# Patient Record
Sex: Female | Born: 1995 | Race: White | Hispanic: No | Smoking: Current every day smoker
Health system: Southern US, Community
[De-identification: ages and names within clinical notes are randomized; demographics above are authoritative.]

## PROBLEM LIST (undated history)

## (undated) DIAGNOSIS — K589 Irritable bowel syndrome without diarrhea: Secondary | ICD-10-CM

## (undated) DIAGNOSIS — F32A Depression, unspecified: Secondary | ICD-10-CM

## (undated) DIAGNOSIS — F419 Anxiety disorder, unspecified: Secondary | ICD-10-CM

## (undated) DIAGNOSIS — K219 Gastro-esophageal reflux disease without esophagitis: Secondary | ICD-10-CM

## (undated) HISTORY — DX: Irritable bowel syndrome without diarrhea: K58.9

## (undated) HISTORY — PX: WISDOM TOOTH EXTRACTION: SHX21

## (undated) HISTORY — PX: CHOLECYSTECTOMY: SHX55

## (undated) HISTORY — DX: Gastro-esophageal reflux disease without esophagitis: K21.9

---

## 2018-12-01 NOTE — L&D Delivery Note (Addendum)
Delivery Note At 8:07 PM a viable and healthy female was delivered via Vaginal, Spontaneous (Presentation: LOA, L hand).  APGAR: pending charting, Placenta status: delivered intact.  Cord: 3 vessel  with the following complications: with postpartum hemorrhage.   Fundus was boggy, with fundal rub, inside of uterus swept manually with no obvious products of conception. Patient was given TXA, methergine, 824mcg rectal cytotec with no improvement. Patient continued to have poor tone and bleeding so attending and fellow were call Hemabate was then administed and then a Bakri balloon was inserted and filled to 500cc. The bleeding was successfully tamponaded. CBC sent and is pending   Anesthesia:  Epidural Episiotomy:  none Lacerations: none  Suture Repair: none Est. Blood Loss (mL):  859mL  Mom to postpartum.  Baby to Couplet care / Skin to Skin.  Nikki Dom Driver 2/63/7858, 8:50 PM  Patient is a G1P0 at [redacted]w[redacted]d who was admitted SROM, uncomplicated prenatal course.  She progressed with augmentation via cytotec and FB.  I was gloved and present for delivery in its entirety.  Second stage of labor progressed, baby delivered after 30 minutes.    Immediate PPH noted after delivery of placenta. CNM took over management of [place]. Methergine, cytotec, TXA given with minimal improvement. Manual sweep revealed no products of conception. Dr. Elonda Husky called to bedside. Hemobate given and bakari balloon placed. Bleeding stopped after placement of balloon.   Patient and FOB updated with course and plan of care. Verbalized understanding.   Wende Mott, CNM 9:00 PM

## 2019-03-14 ENCOUNTER — Encounter: Payer: Self-pay | Admitting: *Deleted

## 2019-03-15 ENCOUNTER — Other Ambulatory Visit: Payer: Self-pay

## 2019-03-15 ENCOUNTER — Encounter: Payer: Self-pay | Admitting: Certified Nurse Midwife

## 2019-03-15 ENCOUNTER — Ambulatory Visit (INDEPENDENT_AMBULATORY_CARE_PROVIDER_SITE_OTHER): Payer: Medicaid Other | Admitting: Certified Nurse Midwife

## 2019-03-15 ENCOUNTER — Other Ambulatory Visit (HOSPITAL_COMMUNITY)
Admission: RE | Admit: 2019-03-15 | Discharge: 2019-03-15 | Disposition: A | Discharge status: Home / Self Care | Payer: Medicaid Other | Source: Ambulatory Visit | Attending: Certified Nurse Midwife | Admitting: Certified Nurse Midwife

## 2019-03-15 VITALS — BP 96/59 | HR 77 | Ht 60.0 in | Wt 109.0 lb

## 2019-03-15 DIAGNOSIS — Z3400 Encounter for supervision of normal first pregnancy, unspecified trimester: Secondary | ICD-10-CM | POA: Diagnosis present

## 2019-03-15 DIAGNOSIS — O99330 Smoking (tobacco) complicating pregnancy, unspecified trimester: Secondary | ICD-10-CM

## 2019-03-15 DIAGNOSIS — O219 Vomiting of pregnancy, unspecified: Secondary | ICD-10-CM

## 2019-03-15 DIAGNOSIS — B373 Candidiasis of vulva and vagina: Secondary | ICD-10-CM | POA: Diagnosis not present

## 2019-03-15 DIAGNOSIS — O0930 Supervision of pregnancy with insufficient antenatal care, unspecified trimester: Secondary | ICD-10-CM | POA: Diagnosis not present

## 2019-03-15 DIAGNOSIS — B3731 Acute candidiasis of vulva and vagina: Secondary | ICD-10-CM

## 2019-03-15 DIAGNOSIS — Z3A19 19 weeks gestation of pregnancy: Secondary | ICD-10-CM

## 2019-03-15 DIAGNOSIS — O0932 Supervision of pregnancy with insufficient antenatal care, second trimester: Secondary | ICD-10-CM | POA: Diagnosis not present

## 2019-03-15 MED ORDER — ONDANSETRON 4 MG PO TBDP: 4.0000 mg | ORAL_TABLET | Freq: Three times a day (TID) | ORAL | 0 refills | Status: DC | PRN

## 2019-03-15 NOTE — Progress Notes (Signed)
History:   Kelly Allen is a 23 y.o. G1P0 at 7572w2d by LMP being seen today for her first obstetrical visit.  Her obstetrical history is significant for smoker and late to prenatal care. Patient does intend to breast feed. Pregnancy history fully reviewed.  Patient reports nausea.      HISTORY: OB History  Gravida Para Term Preterm AB Living  1 0 0 0 0 0  SAB TAB Ectopic Multiple Live Births  0 0 0 0 0    # Outcome Date GA Lbr Len/2nd Weight Sex Delivery Anes PTL Lv  1 Current             Patient has never had pap smear prior to 23yo   Past Medical History:  Diagnosis Date  . Acid reflux   . IBS (irritable bowel syndrome)    Past Surgical History:  Procedure Laterality Date  . CHOLECYSTECTOMY    . WISDOM TOOTH EXTRACTION     Family History  Problem Relation Age of Onset  . Diabetes Mother   . Asthma Father   . COPD Father   . Cancer Paternal Grandmother        stomach   Social History   Tobacco Use  . Smoking status: Current Every Day Smoker    Packs/day: 0.25    Years: 7.00    Pack years: 1.75    Types: Cigarettes  . Smokeless tobacco: Never Used  Substance Use Topics  . Alcohol use: Not Currently  . Drug use: Not Currently   No Known Allergies Current Outpatient Medications on File Prior to Visit  Medication Sig Dispense Refill  . Prenatal Vit-Fe Fumarate-FA (PRENATAL VITAMINS PO) Take by mouth.     No current facility-administered medications on file prior to visit.     Review of Systems Pertinent items noted in HPI and remainder of comprehensive ROS otherwise negative. Physical Exam:   Vitals:   03/15/19 0955 03/15/19 0956  BP: (!) 96/59   Pulse: 77   Weight: 109 lb (49.4 kg)   Height:  5' (1.524 m)   Fetal Heart Rate (bpm): 161 Uterus:  Fundal Height: 20 cm  Pelvic Exam: Perineum: no hemorrhoids, normal perineum   Vulva: normal external genitalia, no lesions   Vagina:  normal mucosa, normal discharge   Cervix: no lesions and normal, pap  smear done.    Adnexa: normal adnexa and no mass, fullness, tenderness   Bony Pelvis: average  System: General: well-developed, well-nourished female in no acute distress   Breasts:  normal appearance, no masses or tenderness bilaterally   Skin: normal coloration and turgor, no rashes   Neurologic: oriented, normal, negative, normal mood   Extremities: normal strength, tone, and muscle mass, ROM of all joints is normal   HEENT PERRLA, extraocular movement intact and sclera clear   Mouth/Teeth mucous membranes moist, pharynx normal without lesions and dental hygiene good   Neck supple and no masses   Cardiovascular: regular rate and rhythm   Respiratory:  no respiratory distress, normal breath sounds   Abdomen: soft, non-tender; bowel sounds normal; no masses,  no organomegaly    Assessment:    Pregnancy: G1P0 Patient Active Problem List   Diagnosis Date Noted  . Supervision of normal first pregnancy, antepartum 03/15/2019  . Tobacco use during pregnancy, antepartum 03/15/2019  . Late prenatal care 03/15/2019     Plan:    1. Supervision of normal first pregnancy, antepartum - Enroll patient in Babyscripts Program - [place] MFM OB  COMP + 14 WK; Future - Cytology - PAP( [place]) - Genetic Screening - Alpha fetoprotein, maternal - Hemoglobinopathy Evaluation  2. Late prenatal care - care initiated at 19 weeks  - Obstetric panel - HIV antibody (with reflex) - Culture, OB Urine - Babyscripts Schedule Optimization - [place] MFM OB COMP + 14 WK; Future - Cytology - PAP( [place]) - Genetic Screening - Alpha fetoprotein, maternal  3. Tobacco use during pregnancy, antepartum - Patient reports smoking cigarettes currently, reports smoking 2-3 cigarettes per day  - Educated and discussed risks to smoking during pregnancy including IUGR and fetal demise   Initial labs drawn. Continue prenatal vitamins. Genetic Screening discussed, NIPS: ordered. Ultrasound discussed; fetal  anatomic survey: ordered. Problem list reviewed and updated. The nature of [facility] with multiple MDs and other Advanced Practice Providers was explained to patient; also emphasized that residents, students are part of our team. Routine obstetric precautions reviewed. Return in about 4 weeks (around 04/12/2019) for TELEHEALTH ROB.    Sharyon Cable, CNM [facility] [employer], [facility]

## 2019-03-15 NOTE — Patient Instructions (Addendum)
Glucose Tolerance Test During Pregnancy Why am I having this test? The glucose tolerance test (GTT) is done to check how your body processes sugar (glucose). This is one of several tests used to diagnose diabetes that develops during pregnancy (gestational diabetes mellitus). Gestational diabetes is a temporary form of diabetes that some women develop during pregnancy. It usually occurs during the second trimester of pregnancy and goes away after delivery. Testing (screening) for gestational diabetes usually occurs between 24 and 28 weeks of pregnancy. You may have the GTT test after having a 1-hour glucose screening test if the results from that test indicate that you may have gestational diabetes. You may also have this test if:  You have a history of gestational diabetes.  You have a history of giving birth to very large babies or have experienced repeated fetal loss (stillbirth).  You have signs and symptoms of diabetes, such as: ? Changes in your vision. ? Tingling or numbness in your hands or feet. ? Changes in hunger, thirst, and urination that are not otherwise explained by your pregnancy. What is being tested? This test measures the amount of glucose in your blood at different times during a period of 3 hours. This indicates how well your body is able to process glucose. What kind of sample is taken?  Blood samples are required for this test. They are usually collected by inserting a needle into a blood vessel. How do I prepare for this test?  For 3 days before your test, eat normally. Have plenty of carbohydrate-rich foods.  Follow instructions from your health care provider about: ? Eating or drinking restrictions on the day of the test. You may be asked to not eat or drink anything other than water (fast) starting 8-10 hours before the test. ? Changing or stopping your regular medicines. Some medicines may interfere with this test. Tell a health care provider about:  All  medicines you are taking, including vitamins, herbs, eye drops, creams, and over-the-counter medicines.  Any blood disorders you have.  Any surgeries you have had.  Any medical conditions you have. What happens during the test? First, your blood glucose will be measured. This is referred to as your fasting blood glucose, since you fasted before the test. Then, you will drink a glucose solution that contains a certain amount of glucose. Your blood glucose will be measured again 1, 2, and 3 hours after drinking the solution. This test takes about 3 hours to complete. You will need to stay at the testing location during this time. During the testing period:  Do not eat or drink anything other than the glucose solution.  Do not exercise.  Do not use any products that contain nicotine or tobacco, such as cigarettes and e-cigarettes. If you need help stopping, ask your health care provider. The testing procedure may vary among health care providers and hospitals. How are the results reported? Your results will be reported as milligrams of glucose per deciliter of blood (mg/dL) or millimoles per liter (mmol/L). Your health care provider will compare your results to normal ranges that were established after testing a large group of people (reference ranges). Reference ranges may vary among labs and hospitals. For this test, common reference ranges are:  Fasting: less than 95-105 mg/dL (5.3-5.8 mmol/L).  1 hour after drinking glucose: less than 180-190 mg/dL (10.0-10.5 mmol/L).  2 hours after drinking glucose: less than 155-165 mg/dL (8.6-9.2 mmol/L).  3 hours after drinking glucose: 140-145 mg/dL (7.8-8.1 mmol/L). What do the   hour after drinking glucose: less than 180-190 mg/dL (10.0-10.5 mmol/L).   2 hours after drinking glucose: less than 155-165 mg/dL (8.6-9.2 mmol/L).   3 hours after drinking glucose: 140-145 mg/dL (7.8-8.1 mmol/L).  What do the results mean?  Results within reference ranges are considered normal, meaning that your glucose levels are well-controlled. If two or more of your blood glucose levels are high, you may be diagnosed with gestational diabetes. If only one level is high, your health care  provider may suggest repeat testing or other tests to confirm a diagnosis.  Talk with your health care provider about what your results mean.  Questions to ask your health care provider  Ask your health care provider, or the department that is doing the test:   When will my results be ready?   How will I get my results?   What are my treatment options?   What other tests do I need?   What are my next steps?  Summary   The glucose tolerance test (GTT) is one of several tests used to diagnose diabetes that develops during pregnancy (gestational diabetes mellitus). Gestational diabetes is a temporary form of diabetes that some women develop during pregnancy.   You may have the GTT test after having a 1-hour glucose screening test if the results from that test indicate that you may have gestational diabetes. You may also have this test if you have any symptoms or risk factors for gestational diabetes.   Talk with your health care provider about what your results mean.  This information is not intended to replace advice given to you by your health care provider. Make sure you discuss any questions you have with your health care provider.  Document Released: 05/18/2012 Document Revised: 06/29/2017 Document Reviewed: 06/29/2017  Elsevier Interactive Patient Education  2019 [employer]vier Inc[employer]  Second Trimester of Pregnancy    The second trimester is from week 14 through week 27 (month 4 through 6). This is often the time in pregnancy that you feel your best. Often times, morning sickness has lessened or quit. You may have more energy, and you may get hungry more often. Your unborn baby is growing rapidly. At the end of the sixth month, he or she is about 9 inches long and weighs about 1 pounds. You will likely feel the baby move between 18 and 20 weeks of pregnancy.  Follow these instructions at home:  Medicines   Take over-the-counter and prescription medicines only as told by your doctor. Some medicines are safe and  some medicines are not safe during pregnancy.   Take a prenatal vitamin that contains at least 600 micrograms (mcg) of folic acid.   If you have trouble pooping (constipation), take medicine that will make your stool soft (stool softener) if your doctor approves.  Eating and drinking     Eat regular, healthy meals.   Avoid raw meat and uncooked cheese.   If you get low calcium from the food you eat, talk to your doctor about taking a daily calcium supplement.   Avoid foods that are high in fat and sugars, such as fried and sweet foods.   If you feel sick to your stomach (nauseous) or throw up (vomit):  ? Eat 4 or 5 small meals a day instead of 3 large meals.  ? Try eating a few soda crackers.  ? Drink liquids between meals instead of during meals.   To prevent constipation:  ? Eat foods that are high in fiber, like   fresh fruits and vegetables, whole grains, and beans.  ? Drink enough fluids to keep your pee (urine) clear or pale yellow.  Activity   Exercise only as told by your doctor. Stop exercising if you start to have cramps.   Do not exercise if it is too hot, too humid, or if you are in a place of great height (high altitude).   Avoid heavy lifting.   Wear low-heeled shoes. Sit and stand up straight.   You can continue to have sex unless your doctor tells you not to.  Relieving pain and discomfort   Wear a good support bra if your breasts are tender.   Take warm water baths (sitz baths) to soothe pain or discomfort caused by hemorrhoids. Use hemorrhoid cream if your doctor approves.   Rest with your legs raised if you have leg cramps or low back pain.   If you develop puffy, bulging veins (varicose veins) in your legs:  ? Wear support hose or compression stockings as told by your doctor.  ? Raise (elevate) your feet for 15 minutes, 3-4 times a day.  ? Limit salt in your food.  Prenatal care   Write down your questions. Take them to your prenatal visits.   Keep all your prenatal visits as  told by your doctor. This is important.  Safety   Wear your seat belt when driving.   Make a list of emergency phone numbers, including numbers for family, friends, the hospital, and police and fire departments.  General instructions   Ask your doctor about the right foods to eat or for help finding a counselor, if you need these services.   Ask your doctor about local prenatal classes. Begin classes before month 6 of your pregnancy.   Do not use hot tubs, steam rooms, or saunas.   Do not douche or use tampons or scented sanitary pads.   Do not cross your legs for long periods of time.   Visit your dentist if you have not done so. Use a soft toothbrush to brush your teeth. Floss gently.   Avoid all smoking, herbs, and alcohol. Avoid drugs that are not approved by your doctor.   Do not use any products that contain nicotine or tobacco, such as cigarettes and e-cigarettes. If you need help quitting, ask your doctor.   Avoid cat litter boxes and soil used by cats. These carry germs that can cause birth defects in the baby and can cause a loss of your baby (miscarriage) or stillbirth.  Contact a doctor if:   You have mild cramps or pressure in your lower belly.   You have pain when you pee (urinate).   You have bad smelling fluid coming from your vagina.   You continue to feel sick to your stomach (nauseous), throw up (vomit), or have watery poop (diarrhea).   You have a nagging pain in your belly area.   You feel dizzy.  Get help right away if:   You have a fever.   You are leaking fluid from your vagina.   You have spotting or bleeding from your vagina.   You have severe belly cramping or pain.   You lose or gain weight rapidly.   You have trouble catching your breath and have chest pain.   You notice sudden or extreme puffiness (swelling) of your face, hands, ankles, feet, or legs.   You have not felt the baby move in over an hour.   You have severe headaches   that do not go away when you  take medicine.   You have trouble seeing.  Summary   The second trimester is from week 14 through week 27 (months 4 through 6). This is often the time in pregnancy that you feel your best.   To take care of yourself and your unborn baby, you will need to eat healthy meals, take medicines only if your doctor tells you to do so, and do activities that are safe for you and your baby.   Call your doctor if you get sick or if you notice anything unusual about your pregnancy. Also, call your doctor if you need help with the right food to eat, or if you want to know what activities are safe for you.  This information is not intended to replace advice given to you by your health care provider. Make sure you discuss any questions you have with your health care provider.  Document Released: 02/11/2010 Document Revised: 12/23/2016 Document Reviewed: 12/23/2016  Elsevier Interactive Patient Education  2019 Elsevier Inc.

## 2019-03-16 LAB — CYTOLOGY - PAP
Chlamydia: NEGATIVE
Diagnosis: NEGATIVE
Neisseria Gonorrhea: NEGATIVE

## 2019-03-17 ENCOUNTER — Telehealth: Payer: Self-pay

## 2019-03-17 LAB — HEMOGLOBINOPATHY EVALUATION
Fetal Hemoglobin Testing: <1 % (ref 0.0–1.9)
HCT: 35.7 % (ref 35.0–45.0)
Hemoglobin A2 - HGBRFX: 2.4 % (ref 1.8–3.5)
Hemoglobin: 12 g/dL (ref 11.7–15.5)
Hgb A: 96.6 % (ref >96.0)
MCH: 31.7 pg (ref 27.0–33.0)
MCV: 94.4 fL (ref 80.0–100.0)
RBC: 3.78 10*6/uL — ABNORMAL LOW (ref 3.80–5.10)
RDW: 12.9 % (ref 11.0–15.0)

## 2019-03-17 LAB — CULTURE, OB URINE

## 2019-03-17 LAB — URINE CULTURE, OB REFLEX

## 2019-03-17 MED ORDER — TERCONAZOLE 0.4 % VA CREA: 1.0000 | TOPICAL_CREAM | Freq: Every day | VAGINAL | 0 refills | Status: DC

## 2019-03-17 NOTE — Addendum Note (Signed)
Addended by: Sharyon Cable on: 03/17/2019 08:56 AM   Modules accepted: Orders

## 2019-03-17 NOTE — Telephone Encounter (Addendum)
Left a VM for pt letting her know results ----- Message from Sharyon Cable, CNM sent at 03/17/2019  8:56 AM EDT ----- Normal pap, patient has yeast infection on pap. Please call patient and notify of results. Rx for yeast infection sent to pharmacy on file.  Sharyon Cable, CNM 03/17/19, 8:55 AM

## 2019-03-21 ENCOUNTER — Telehealth: Payer: Self-pay

## 2019-03-21 DIAGNOSIS — Z3400 Encounter for supervision of normal first pregnancy, unspecified trimester: Secondary | ICD-10-CM

## 2019-03-21 NOTE — Telephone Encounter (Signed)
Left VM for pt letting her know we have NIPS results for her and provided office number for her to call back

## 2019-03-23 ENCOUNTER — Ambulatory Visit (HOSPITAL_COMMUNITY)
Admission: RE | Admit: 2019-03-23 | Discharge: 2019-03-23 | Disposition: A | Discharge status: Home / Self Care | Payer: Medicaid Other | Source: Ambulatory Visit | Attending: Obstetrics and Gynecology | Admitting: Obstetrics and Gynecology

## 2019-03-23 ENCOUNTER — Other Ambulatory Visit: Payer: Self-pay

## 2019-03-23 DIAGNOSIS — O0932 Supervision of pregnancy with insufficient antenatal care, second trimester: Secondary | ICD-10-CM

## 2019-03-23 DIAGNOSIS — O99332 Smoking (tobacco) complicating pregnancy, second trimester: Secondary | ICD-10-CM

## 2019-03-23 DIAGNOSIS — O0930 Supervision of pregnancy with insufficient antenatal care, unspecified trimester: Secondary | ICD-10-CM | POA: Insufficient documentation

## 2019-03-23 DIAGNOSIS — Z363 Encounter for antenatal screening for malformations: Secondary | ICD-10-CM

## 2019-03-23 DIAGNOSIS — Z3A18 18 weeks gestation of pregnancy: Secondary | ICD-10-CM

## 2019-03-23 DIAGNOSIS — Z3400 Encounter for supervision of normal first pregnancy, unspecified trimester: Secondary | ICD-10-CM | POA: Diagnosis present

## 2019-03-23 DIAGNOSIS — Z3687 Encounter for antenatal screening for uncertain dates: Secondary | ICD-10-CM | POA: Diagnosis not present

## 2019-03-24 ENCOUNTER — Other Ambulatory Visit (HOSPITAL_COMMUNITY): Payer: Self-pay | Admitting: *Deleted

## 2019-03-24 DIAGNOSIS — Z362 Encounter for other antenatal screening follow-up: Secondary | ICD-10-CM

## 2019-03-24 LAB — ALPHA FETOPROTEIN, MATERNAL
AFP MoM: 0.59
AFP, Serum: 37.5 ng/mL
Calc'd Gestational Age: 19.3 weeks
Maternal Wt: 109 [lb_av]
Risk for ONTD: <1
Twins-AFP: 1

## 2019-03-24 LAB — OBSTETRIC PANEL
Absolute Monocytes: 504 cells/uL (ref 200–950)
Antibody Screen: NOT DETECTED
Basophils Absolute: 18 cells/uL (ref 0–200)
Basophils Relative: 0.2 %
Eosinophils Absolute: 81 cells/uL (ref 15–500)
Eosinophils Relative: 0.9 %
HCT: 34.7 % — ABNORMAL LOW (ref 35.0–45.0)
Hemoglobin: 12.2 g/dL (ref 11.7–15.5)
Hepatitis B Surface Ag: NONREACTIVE
Lymphs Abs: 1503 cells/uL (ref 850–3900)
MCH: 32.3 pg (ref 27.0–33.0)
MCHC: 35.2 g/dL (ref 32.0–36.0)
MCV: 91.8 fL (ref 80.0–100.0)
MPV: 10.4 fL (ref 7.5–12.5)
Monocytes Relative: 5.6 %
Neutro Abs: 6894 cells/uL (ref 1500–7800)
Neutrophils Relative %: 76.6 %
Platelets: 235 10*3/uL (ref 140–400)
RBC: 3.78 10*6/uL — ABNORMAL LOW (ref 3.80–5.10)
RDW: 12.6 % (ref 11.0–15.0)
RPR Ser Ql: NONREACTIVE
Rubella: 3.33 index
Total Lymphocyte: 16.7 %
WBC: 9 10*3/uL (ref 3.8–10.8)

## 2019-03-24 LAB — TIQ-AOE

## 2019-03-24 LAB — HIV ANTIBODY (ROUTINE TESTING W REFLEX): HIV 1&2 Ab, 4th Generation: NONREACTIVE

## 2019-04-12 ENCOUNTER — Encounter: Payer: Medicaid Other | Admitting: Certified Nurse Midwife

## 2019-04-13 ENCOUNTER — Ambulatory Visit (INDEPENDENT_AMBULATORY_CARE_PROVIDER_SITE_OTHER): Payer: Medicaid Other | Admitting: Obstetrics and Gynecology

## 2019-04-13 ENCOUNTER — Other Ambulatory Visit: Payer: Self-pay

## 2019-04-13 DIAGNOSIS — Z3402 Encounter for supervision of normal first pregnancy, second trimester: Secondary | ICD-10-CM

## 2019-04-13 DIAGNOSIS — Z3400 Encounter for supervision of normal first pregnancy, unspecified trimester: Secondary | ICD-10-CM

## 2019-04-13 NOTE — Patient Instructions (Signed)
Tdap Vaccine (Tetanus, Diphtheria and Pertussis): What You Need to Know  1. Why get vaccinated?  Tetanus, diphtheria and pertussis are very serious diseases. Tdap vaccine can protect us from these diseases. And, Tdap vaccine given to pregnant women can protect newborn babies against pertussis..  TETANUS (Lockjaw) is rare in the [place] today. It causes painful muscle tightening and stiffness, usually all over the body.  · It can lead to tightening of muscles in the head and neck so you can't open your mouth, swallow, or sometimes even breathe. Tetanus kills about 1 out of 10 people who are infected even after receiving the best medical care.  DIPHTHERIA is also rare in the [place] today. It can cause a thick coating to form in the back of the throat.  · It can lead to breathing problems, heart failure, paralysis, and death.  PERTUSSIS (Whooping Cough) causes severe coughing spells, which can cause difficulty breathing, vomiting and disturbed sleep.  · It can also lead to weight loss, incontinence, and rib fractures. Up to 2 in 100 adolescents and 5 in 100 adults with pertussis are hospitalized or have complications, which could include pneumonia or death.  These diseases are caused by bacteria. Diphtheria and pertussis are spread from person to person through secretions from coughing or sneezing. Tetanus enters the body through cuts, scratches, or wounds.  Before vaccines, as many as 200,000 cases of diphtheria, 200,000 cases of pertussis, and hundreds of cases of tetanus, were reported in the [place] each year. Since vaccination began, reports of cases for tetanus and diphtheria have dropped by about 99% and for pertussis by about 80%.  2. Tdap vaccine  Tdap vaccine can protect adolescents and adults from tetanus, diphtheria, and pertussis. One dose of Tdap is routinely given at age 11 or 12. People who did not get Tdap at that age should get it as soon as possible.  Tdap is especially important  for healthcare professionals and anyone having close contact with a baby younger than 12 months.  Pregnant women should get a dose of Tdap during every pregnancy, to protect the newborn from pertussis. Infants are most at risk for severe, life-threatening complications from pertussis.  Another vaccine, called Td, protects against tetanus and diphtheria, but not pertussis. A Td booster should be given every 10 years. Tdap may be given as one of these boosters if you have never gotten Tdap before. Tdap may also be given after a severe cut or burn to prevent tetanus infection.  Your doctor or the person giving you the vaccine can give you more information.  Tdap may safely be given at the same time as other vaccines.  3. Some people should not get this vaccine  · A person who has ever had a life-threatening allergic reaction after a previous dose of any diphtheria, tetanus or pertussis containing vaccine, OR has a severe allergy to any part of this vaccine, should not get Tdap vaccine. Tell the person giving the vaccine about any severe allergies.  · Anyone who had coma or long repeated seizures within 7 days after a childhood dose of DTP or DTaP, or a previous dose of Tdap, should not get Tdap, unless a cause other than the vaccine was found. They can still get Td.  · Talk to your doctor if you:  ? have seizures or another nervous system problem,  ? had severe pain or swelling after any vaccine containing diphtheria, tetanus or pertussis,  ? ever had a condition   called Guillain-Barré Syndrome (GBS),  ? aren't feeling well on the day the shot is scheduled.  4. Risks  With any medicine, including vaccines, there is a chance of side effects. These are usually mild and go away on their own. Serious reactions are also possible but are rare.  Most people who get Tdap vaccine do not have any problems with it.  Mild problems following Tdap  (Did not interfere with activities)  · Pain where the shot was given (about 3 in 4  adolescents or 2 in 3 adults)  · Redness or swelling where the shot was given (about 1 person in 5)  · Mild fever of at least 100.4°F (up to about 1 in 25 adolescents or 1 in 100 adults)  · Headache (about 3 or 4 people in 10)  · Tiredness (about 1 person in 3 or 4)  · Nausea, vomiting, diarrhea, stomach ache (up to 1 in 4 adolescents or 1 in 10 adults)  · Chills, sore joints (about 1 person in 10)  · Body aches (about 1 person in 3 or 4)  · Rash, swollen glands (uncommon)  Moderate problems following Tdap  (Interfered with activities, but did not require medical attention)  · Pain where the shot was given (up to 1 in 5 or 6)  · Redness or swelling where the shot was given (up to about 1 in 16 adolescents or 1 in 12 adults)  · Fever over 102°F (about 1 in 100 adolescents or 1 in 250 adults)  · Headache (about 1 in 7 adolescents or 1 in 10 adults)  · Nausea, vomiting, diarrhea, stomach ache (up to 1 or 3 people in 100)  · Swelling of the entire arm where the shot was given (up to about 1 in 500).  Severe problems following Tdap  (Unable to perform usual activities; required medical attention)  · Swelling, severe pain, bleeding and redness in the arm where the shot was given (rare).  Problems that could happen after any vaccine:  · People sometimes faint after a medical procedure, including vaccination. Sitting or lying down for about 15 minutes can help prevent fainting, and injuries caused by a fall. Tell your doctor if you feel dizzy, or have vision changes or ringing in the ears.  · Some people get severe pain in the shoulder and have difficulty moving the arm where a shot was given. This happens very rarely.  · Any medication can cause a severe allergic reaction. Such reactions from a vaccine are very rare, estimated at fewer than 1 in a million doses, and would happen within a few minutes to a few hours after the vaccination.  As with any medicine, there is a very remote chance of a vaccine causing a serious  injury or death.  The safety of vaccines is always being monitored. For more information, visit: www.cdc.gov/vaccinesafety/  5. What if there is a serious problem?  What should I look for?  · Look for anything that concerns you, such as signs of a severe allergic reaction, very high fever, or unusual behavior.  Signs of a severe allergic reaction can include hives, swelling of the face and throat, difficulty breathing, a fast heartbeat, dizziness, and weakness. These would usually start a few minutes to a few hours after the vaccination.  What should I do?  · If you think it is a severe allergic reaction or other emergency that can't wait, call 9-1-1 or get the person to the nearest hospital. Otherwise,   call your doctor.  · Afterward, the reaction should be reported to the Vaccine Adverse Event Reporting System (VAERS). Your doctor might file this report, or you can do it yourself through the VAERS web site at www.vaers.hhs.gov, or by calling 1-800-822-7967.  VAERS does not give medical advice.  6. The National Vaccine Injury Compensation Program  The National Vaccine Injury Compensation Program (VICP) is a federal program that was created to compensate people who may have been injured by certain vaccines.  Persons who believe they may have been injured by a vaccine can learn about the program and about filing a claim by calling 1-800-338-2382 or visiting the VICP website at www.hrsa.gov/vaccinecompensation. There is a time limit to file a claim for compensation.  7. How can I learn more?  · Ask your doctor. He or she can give you the vaccine package insert or suggest other sources of information.  · Call your local or state health department.  · Contact the Centers for Disease Control and Prevention (CDC):  ? Call 1-800-232-4636 (1-800-CDC-INFO) or  ? Visit CDC's website at www.cdc.gov/vaccines  Vaccine Information Statement Tdap Vaccine (01/24/2014)  This information is not intended to replace advice given to you  by your health care provider. Make sure you discuss any questions you have with your health care provider.  Document Released: 05/18/2012 Document Revised: 07/05/2018 Document Reviewed: 07/05/2018  Elsevier Interactive Patient Education © 2019 [employer]

## 2019-04-13 NOTE — Progress Notes (Signed)
   TELEHEALTH VIRTUAL OBSTETRICS VISIT ENCOUNTER NOTE  I connected with Kelly Allen on 04/14/19 at  9:45 AM EDT by Webex at home and verified that I am speaking with the correct person using two identifiers.   I discussed the limitations, risks, security and privacy concerns of performing an evaluation and management service by telephone and the availability of in person appointments. I also discussed with the patient that there may be a patient responsible charge related to this service. The patient expressed understanding and agreed to proceed.  Subjective:  Kelly Allen is a 23 y.o. G1P0 at [redacted]w[redacted]d being followed for ongoing prenatal care.  She is currently monitored for the following issues for this low-risk pregnancy and has Supervision of normal first pregnancy, antepartum; Tobacco use during pregnancy, antepartum; and Late prenatal care on their problem list.  Patient reports no complaints. Reports fetal movement. Denies any contractions, bleeding or leaking of fluid.   The following portions of the patient's history were reviewed and updated as appropriate: allergies, current medications, past family history, past medical history, past social history, past surgical history and problem list.   Objective:   General:  Alert, oriented and cooperative.   Mental Status: Normal mood and affect perceived. Normal judgment and thought content.  Rest of physical exam deferred due to type of encounter  Assessment and Plan:  Pregnancy: G1P0 at [redacted]w[redacted]d  1. Supervision of normal first pregnancy, antepartum   BP: 114/78 Doing well no concerns Tdap next visit.  Next visit in person for 2 hour GTT   Preterm labor symptoms and general obstetric precautions including but not limited to vaginal bleeding, contractions, leaking of fluid and fetal movement were reviewed in detail with the patient.  I discussed the assessment and treatment plan with the patient. The patient was provided an opportunity to  ask questions and all were answered. The patient agreed with the plan and demonstrated an understanding of the instructions. The patient was advised to call back or seek an in-person office evaluation/go to [facility] at [facility] for any urgent or concerning symptoms. Please refer to After Visit Summary for other counseling recommendations.   I provided 11 minutes of non-face-to-face time during this encounter.  Return in about 4 weeks (around 05/11/2019) for In person visit for 2 hour GTT.  Future Appointments  Date Time Provider Department Center  05/05/2019  1:45 PM [facility] [place] [facility]  05/11/2019  8:15 AM Guido Comp, Harolyn Rutherford, NP [facility]    Venia Carbon, NP [facility] [employer], [facility]

## 2019-04-13 NOTE — Progress Notes (Signed)
BP on 04/11/19: 114/78

## 2019-05-05 ENCOUNTER — Ambulatory Visit (HOSPITAL_COMMUNITY)
Admission: RE | Admit: 2019-05-05 | Discharge: 2019-05-05 | Disposition: A | Discharge status: Home / Self Care | Payer: Medicaid Other | Source: Ambulatory Visit | Attending: Obstetrics and Gynecology | Admitting: Obstetrics and Gynecology

## 2019-05-05 ENCOUNTER — Other Ambulatory Visit: Payer: Self-pay

## 2019-05-05 DIAGNOSIS — Z3A24 24 weeks gestation of pregnancy: Secondary | ICD-10-CM

## 2019-05-05 DIAGNOSIS — O99332 Smoking (tobacco) complicating pregnancy, second trimester: Secondary | ICD-10-CM

## 2019-05-05 DIAGNOSIS — Z362 Encounter for other antenatal screening follow-up: Secondary | ICD-10-CM | POA: Insufficient documentation

## 2019-05-05 DIAGNOSIS — O0932 Supervision of pregnancy with insufficient antenatal care, second trimester: Secondary | ICD-10-CM

## 2019-05-11 ENCOUNTER — Other Ambulatory Visit: Payer: Self-pay

## 2019-05-11 ENCOUNTER — Ambulatory Visit (INDEPENDENT_AMBULATORY_CARE_PROVIDER_SITE_OTHER): Payer: Medicaid Other | Admitting: Obstetrics and Gynecology

## 2019-05-11 DIAGNOSIS — Z3402 Encounter for supervision of normal first pregnancy, second trimester: Secondary | ICD-10-CM

## 2019-05-11 DIAGNOSIS — Z23 Encounter for immunization: Secondary | ICD-10-CM

## 2019-05-11 DIAGNOSIS — Z3A27 27 weeks gestation of pregnancy: Secondary | ICD-10-CM

## 2019-05-11 DIAGNOSIS — Z3400 Encounter for supervision of normal first pregnancy, unspecified trimester: Secondary | ICD-10-CM

## 2019-05-11 NOTE — Progress Notes (Signed)
   PRENATAL VISIT NOTE  Subjective:  Kelly Allen is a 23 y.o. G1P0 at [redacted]w[redacted]d being seen today for ongoing prenatal care.  She is currently monitored for the following issues for this low-risk pregnancy and has Supervision of normal first pregnancy, antepartum; Tobacco use during pregnancy, antepartum; and Late prenatal care on their problem list.  Patient reports no complaints.  Contractions: Not present. Vag. Bleeding: None.  Movement: Present. Denies leaking of fluid.   The following portions of the patient's history were reviewed and updated as appropriate: allergies, current medications, past family history, past medical history, past social history, past surgical history and problem list.   Objective:   Vitals:   05/11/19 0820  BP: 93/61  Pulse: 75  Weight: 118 lb (53.5 kg)    Fetal Status: Fetal Heart Rate (bpm): 154   Movement: Present     General:  Alert, oriented and cooperative. Patient is in no acute distress.  Skin: Skin is warm and dry. No rash noted.   Cardiovascular: Normal heart rate noted  Respiratory: Normal respiratory effort, no problems with respiration noted  Abdomen: Soft, gravid, appropriate for gestational age.  Pain/Pressure: Absent     Pelvic: Cervical exam deferred        Extremities: Normal range of motion.  Edema: None  Mental Status: Normal mood and affect. Normal behavior. Normal judgment and thought content.   Assessment and Plan:  Pregnancy: G1P0 at [redacted]w[redacted]d 1. Supervision of normal first pregnancy, antepartum  - 2Hr GTT w/ 1 Hr Carpenter 75 g - CBC - RPR - HIV antibody (with reflex) - Dating changed per Dr. Gertie Exon. Patient 25 weeks. She was given her labs slips for her 2 hour GTT and told to come back in 2 weeks to the lab to complete. She is agreeable to that plan of care.   Preterm labor symptoms and general obstetric precautions including but not limited to vaginal bleeding, contractions, leaking of fluid and fetal movement were reviewed in  detail with the patient. Please refer to After Visit Summary for other counseling recommendations.   No follow-ups on file.  Future Appointments  Date Time Provider [place]  06/10/2019  8:45 AM Wende Mott, CNM [facility]    Noni Saupe, NP

## 2019-05-11 NOTE — Patient Instructions (Signed)
AREA PEDIATRIC/FAMILY PRACTICE PHYSICIANS  [place] ([address]) . [facility] o Chambliss, MD; Eniola, MD; Hale, MD; Hensel, MD; McDiarmid, MD; McIntyer, MD; Neal, MD; Walden, MD o [address] [place], [place] [address] o [phone] o Mon-Fri 8:30-12:30, 1:30-5:00 o Providers come to see babies at [facility] o Accepting Medicaid . [facility] o Limited providers who accept newborns: Koirala, MD; Morrow, MD; Wolters, MD o [address], [place], [place] [address] o [phone] o Mon-Fri 8:00-5:30 o Babies seen by providers at [facility] o Does NOT accept Medicaid o Please call early in hospitalization for appointment (limited availability)  . [facility] o [place], MD o [address] [place], [place] [address] o [phone] o Mon, Tue, Thur, Fri 8:30-5:00, Wed 10:00-7:00 (closed 1-2pm) o Babies seen by [facility] providers o Accepting Medicaid . [facility] - Pediatrician o Rubin, MD o [address], [place], [place] [address] o [phone] o Mon-Fri 8:30-5:00, Sat 8:30-12:00 o Provider comes to see babies at [facility] o Accepting Medicaid o Must have been referred from current patients or contacted office prior to delivery . [facility] ([facility]) o Brown, MD; Chandler, MD; Ettefagh, MD; Grant, MD; Lester, MD; McCormick, MD; McQueen, MD; Prose, MD; Simha, MD; Stanley, MD; Stryffeler, NP; Tebben, NP o [address], [place], [place] [address] o [phone] o Mon, Tue, Thur, Fri 8:30-5:30, Wed 9:30-5:30, Sat 8:30-12:30 o Babies seen by [facility] providers o Accepting Medicaid o Only accepting infants of first-time parents or siblings of current patients o Hospital discharge coordinator will make follow-up appointment . Jack Amos o [address],  [place], [place]  [address] o [phone]   Fax - [phone] . [facility] o [address], [place], [place]  [address] o Phone - [phone]   Fax - [phone] . Shilpa Gosrani o [address], [place], [place]  [address] o [phone]  [place] ([address]) . [facility] o Bates, MD; Brassfield, MD; Cooper, Cox, MD; MD; Davis, MD; Dovico, MD; Ettefaugh, MD; Little, MD; Lowe, MD; Keiffer, MD; Melvin, MD; Sumner, MD; Williams, MD o [address], [place], [place] [address] o [phone] o Mon-Fri 8:30-5:00 (extended evenings Mon-Thur as needed), Sat-Sun 10:00-1:00 o Providers come to see babies at [facility] o Accepting Medicaid for families of first-time babies and families with all children in the household age 3 and under. Must register with office prior to making appointment (M-F only). . [facility] o Henson, NP; Knapp, MD; Lalonde, MD; Tysinger, PA o [address] [place], [place] [address] o [phone] o Mon-Fri 8:00-5:00 o Babies seen by providers at [facility] o Does NOT accept Medicaid/Commercial Insurance Only . [facility] ([facility])  o Artis, MD; Barnes, MD; Bratton, MD; Coccaro, MD; Lockett Gardner, MD; Kramer, MD; Marshall, MD; Netherton, MD; Poleto, MD; Skinner, MD o [address] [place],  [address] o [phone] o Mon-Fri 8:30-5:30, Sat (Oct.-Mar.) 9:00-1:00 o Babies seen by providers at [facility] o Accepting Medicaid  [place] ([address]) . [facility] o Reid, MD; Warner, MD o [address], [place],  [address] o [phone] o Mon-Fri 8:30-5:00, Sat 8:30-12:00 o Providers come to see babies at [facility] o Does NOT accept Medicaid . [facility] o Becker, PA; Hagler, MD; Scifres, PA; Sun, MD; Swayne, MD o [address],  [place], [place] [address] o [phone] o Mon-Fri 8:00-5:00 o Babies seen by providers at [facility] o Does NOT accept Medicaid o Only accepting babies of parents who are patients o Please call early in hospitalization for appointment (limited availability) . [facility] o Clark, MD; Frye, MD; Kelleher, MD; Mack, NP; Miller, MD; O'Keller, MD; Patterson, NP; Pudlo, MD; Puzio, MD; Thomas, MD; Tucker, MD; Twiselton, MD o [address], [place], [place] [address] o [phone] o Mon-Fri 8:00-5:00, Sat 9:00-12:00 o Providers come to see babies at [facility] o Does NOT accept Medicaid  [place] ([address]) . [facility] o Limited providers accepting new patients: Brake, NP; Wharton, PA o [address], [place], [place] [address] o [phone] o Mon-Fri 8:00-5:00 o Babies seen by providers at [facility] o Does NOT accept Medicaid o Only accepting babies of parents who are patients o Please call early in hospitalization for appointment (limited availability) . [facility] o Gay, MD; Quinlan, MD o [address] [place], [place] [address] o [phone] (press 1 to schedule appointment) o Mon-Fri 8:00-5:00 o Providers come to see babies at [facility] o Does NOT accept Medicaid . [facility] o Mazer, MD o [address] [place], [place] [address] o [phone] o Mon-Fri 8:30-5:00 (lunch 12:30-1:00), extended hours by appointment only Wed 5:00-6:30 o Babies seen by [facility] providers o Accepting Medicaid . [facility] o Banks, MD; Jordan, MD; Koberlein, MD o [address], [place], [place] [address] o [phone] o Mon-Fri 8:00-5:00 o Babies seen by [facility] providers o Does NOT accept Medicaid . [facility] o Parker, MD; Hunter, MD; Wallace, DO o [address] [place], [place]  [address] o [phone] o Mon-Fri 8:00-5:00 o Babies seen by [facility] providers o Does NOT accept Medicaid . [facility] o Brandon, PA; Brecken, PA; Christy, NP; Dees, MD; DeClaire, MD; DeWeese, MD; Hansen, NP; Mills, NP; Parrish, NP; Smoot, NP; Summer, MD; Vapne, MD o [address] [place], [place] [address] o [phone] o Mon-Fri 8:30-5:00, Sat 10:00-1:00 o Providers come to see babies at [facility] o Does NOT accept Medicaid o Free prenatal information session Tuesdays at 4:45pm . [facility] o Bouska, MD; Gordon, PA; Jeffery, PA; Weber, PA o [address] [place] [address] [place] [address] o [phone] o Mon-Fri 7:30-5:30 o Babies seen by [facility] providers . [facility] o [address], [place], [place]  [address] o [phone]   Fax - [phone]  [place] ([address]) . [facility] o Reese, MD o [address] [place], [place] [address] o [phone] o Mon-Thur 8:00-6:00 o Providers come to see babies at [facility] o Accepting Medicaid . [facility] o Anderson, NP; Badger, MD; Beal, PA; Spencer, PA o [address] [place], [place] [address] o [phone] o Mon-Thur 7:30-7:30, Fri 7:30-4:30 o Babies seen by [facility] providers o Accepting Medicaid . [facility] o Agbuya, MD; Klett, NP; Romgoolam, MD o [address] [place] [address], [place],  [address] o [phone] o Mon-Fri 8:30-5:00, Sat 8:30-12:00 o Providers come to see babies at [facility] o Accepting Medicaid o Must have "Meet & Greet" appointment at office prior to delivery . [facility] o McCord,   MD; Wallace, MD; Wood, MD o [address] [place] [address], [place], [place] [address] o [phone] o Mon-Wed 8:00-6:00, Thur-Fri 8:00-5:00, Sat 9:00-12:00 o Providers come to  see babies at [facility] o Does NOT accept Medicaid o Only accepting siblings of current patients . [facility]  o [address], [place], [place]  [address] o [phone]   Fax - [phone] . [facility] o [address], [place], [place]  [address] o [phone]   Fax - [phone]  [place] ([address]) . [facility] o Cirigliano, DO; Matthews, DO o [address] [place], [place] [address] o [phone] o Mon-Fri 7:00-5:00 o Babies seen by [facility] providers o Does NOT accept Medicaid . [facility] o Briscoe, MD; Howley, PA; Moreira, PA o [address], [place], [place] [address] o [phone] o Mon-Fri 8:00-5:00 o Babies seen by [facility] providers o Accepting Medicaid . [facility] o Boyd, MD; Church, PA; Jones, NP; Osborn, PA o [address], , [place] [address] o [phone] o Mon-Fri 8:00-5:00 o Babies seen by providers at [facility] o Accepting Medicaid  [place] ([address]) . [facility] [place] o [place], DO o [address] [place], [place] [address] o [phone] o Mon-Fri 8:00-5:00 o Babies seen by [facility] providers o Does NOT accept Medicaid o Limited availability, please call early in hospitalization to schedule follow-up . [facility] o Calderon, PA; Cummings, MD; Dillard, MD; Martin, PA; Olson, MD; VanDeven, PA o [address], [place], [place] [address] o [phone] o Mon-Fri 8:30-5:00, Sat 9:00-12:00 o Babies seen by providers at [facility] o Accepting Medicaid o Please register online then schedule online or call office o [email or web address] . [facility] ([facility] o Hunter, NP; Kumar, MD; Martin Rogers, PA o [address], [place], [place] [address] o [phone] o Mon-Fri 8:00-5:00 o Babies seen by providers at [facility] o Accepting Medicaid . [facility] ([facility] o [place], MD; Kristi Fleenor, NP; West, MD o [address], [place], [place] [address] o [phone] o Mon-Fri 8:00-5:30, Sat&Sun by appointment (phones open at 8:30) o Babies seen by [facility] providers o Accepting Medicaid o Must be a first-time baby or sibling of current patient . [facility] [place]  o [address], [place], [place]  [address] o [phone]   Fax - [phone]  [place] ([address]) . [facility] o Brown, PA; Cowen, PA; Rice, MD; Helton, PA; Spry, MD o [address] [place],  [address] o [phone] o Mon-Thur 8:00-7:00, Fri 8:00-5:00, Sat 8:00-12:00, Sun 9:00-12:00 o Babies seen by [facility] providers o Accepting Medicaid . [facility] o Coe-Goins, MD; Marshall, MD; Pierre-Louis, MD o [address] [place] [address], [place],  [address] o [phone] o Mon-Thur 8:00-5:00 o Babies seen by providers at [facility] o Accepting Medicaid . [facility] o Bratton, MD; Coe-Goins, MD; Hayes, MD; Vale, MD; List, MD; Lott, MD; Marshall, MD; Moran, MD; O'Neal, MD; Pierre-Louis, MD; Pitonzo, MD; Scholer, MD; Spangle, MD o [address] [place],    [address] o [phone] o Mon-Fri 8:00-5:30, Sat (Oct.-Mar.) 9:00-1:00 o Babies seen by providers at [facility] o Accepting Medicaid o Must fill out new patient packet, available online at [email or web address] . [facility] ([facility]) o Friddle, NP; Harris, NP; Kelly, NP; Logan, MD;  Melvin, PA; Poth, MD; Ramadoss, MD; Stanton, NP o [address], [place], [place] [address] o [phone] o Mon-Thur 8:00-5:30, Fri 8:00-5:00 o Babies seen by providers at [facility] o Accepting Medicaid  [place] ([address]) . [facility] o Dixon, PA; Wilbur, MD; Pickard, MD; Tapia, PA o [address], [place], [place] [address] o [phone] o Mon-Fri 8:00-5:00 o Babies seen by providers at [facility] o Accepting Medicaid   [place] ([address]) . [facility] [place] o Masneri, DO; Meyers, MD; Nelson, PA o [address], [place], [place] [address] o [phone] o Mon-Fri 8:00-5:00 o Babies seen by providers at [facility] o Does NOT accept Medicaid o Limited appointment availability, please call early in hospitalization  . [facility] [place] o Kunedd, DO; McGowen, MD o [address], [place], [place] [address] o [phone] o Mon-Fri 8:00-5:00 o Babies seen by [facility] providers o Does NOT accept Medicaid . [facility] [place] o Cameron, MD; MacDonald, MD; Michaels, PA; Nayak, MD o [address] [place] [address], [place], [place] [address] o [phone] o Mon-Fri 8:00-5:00 o After hours clinic ([address] [place], [place] [address]) [phone] Mon-Fri 5:00-8:00, Sat 12:00-6:00, Sun 10:00-4:00 o Babies seen by [facility] providers o Accepting Medicaid . [facility] [place] o [address], [place], [place]  [address] o [phone]   Fax - [phone]  [place] ([address]) . [facility] [place] [facility] o Andy, MD o [address], [place], [place] [address] o [phone] o Mon-Fri 8:00-5:00 o Babies seen by [facility] providers o Does NOT accept Medicaid . [facility] [place] ([facility] [place]) o Eksir, MD o [address], [place], [place]  [address] o [phone] o Mon-Thur 8:00-7:00, Fri 8:00-5:00, Sat 8:00-12:00 o Babies seen by providers at [facility] o Accepting Medicaid - but does not have vaccinations in office (must be received elsewhere) o Limited availability, please call early in hospitalization  [place] ([address]) . [facility]  o Charlene Flemming, MD o [address], [place] [address] o [phone]  Fax [phone]   

## 2019-06-06 LAB — CBC
HCT: 32.5 % — ABNORMAL LOW (ref 35.0–45.0)
Hemoglobin: 11.3 g/dL — ABNORMAL LOW (ref 11.7–15.5)
MCH: 32.5 pg (ref 27.0–33.0)
MCHC: 34.8 g/dL (ref 32.0–36.0)
MCV: 93.4 fL (ref 80.0–100.0)
MPV: 10.4 fL (ref 7.5–12.5)
Platelets: 210 10*3/uL (ref 140–400)
RBC: 3.48 10*6/uL — ABNORMAL LOW (ref 3.80–5.10)
RDW: 12.6 % (ref 11.0–15.0)
WBC: 11.2 10*3/uL — ABNORMAL HIGH (ref 3.8–10.8)

## 2019-06-06 LAB — 2HR GTT W 1 HR, CARPENTER, 75 G
Glucose, 1 Hr, Gest: 102 mg/dL (ref 65–179)
Glucose, 2 Hr, Gest: 92 mg/dL (ref 65–152)
Glucose, Fasting, Gest: 81 mg/dL (ref 65–91)

## 2019-06-06 LAB — HIV ANTIBODY (ROUTINE TESTING W REFLEX): HIV 1&2 Ab, 4th Generation: NONREACTIVE

## 2019-06-06 LAB — RPR: RPR Ser Ql: NONREACTIVE

## 2019-06-07 ENCOUNTER — Telehealth: Payer: Self-pay

## 2019-06-07 NOTE — Telephone Encounter (Signed)
Spoke with pt and she is aware that she passed 2 hour gtt

## 2019-06-10 ENCOUNTER — Telehealth (INDEPENDENT_AMBULATORY_CARE_PROVIDER_SITE_OTHER): Payer: Medicaid Other

## 2019-06-10 VITALS — Wt 124.0 lb

## 2019-06-10 DIAGNOSIS — Z3400 Encounter for supervision of normal first pregnancy, unspecified trimester: Secondary | ICD-10-CM

## 2019-06-10 DIAGNOSIS — Z3403 Encounter for supervision of normal first pregnancy, third trimester: Secondary | ICD-10-CM

## 2019-06-10 DIAGNOSIS — Z3A29 29 weeks gestation of pregnancy: Secondary | ICD-10-CM

## 2019-06-10 NOTE — Progress Notes (Signed)
   TELEHEALTH VIRTUAL OBSTETRICS VISIT ENCOUNTER NOTE  I connected with Kelly Allen on 06/10/19 at  8:45 AM EDT by MyChart Virtual Visit at home and verified that I am speaking with the correct person using two identifiers.   I discussed the limitations, risks, security and privacy concerns of performing an evaluation and management service by telephone and the availability of in person appointments. I also discussed with the patient that there may be a patient responsible charge related to this service. The patient expressed understanding and agreed to proceed.  Subjective:  Kelly Allen is a 23 y.o. G1P0 at [redacted]w[redacted]d being followed for ongoing prenatal care.  She is currently monitored for the following issues for this low-risk pregnancy and has Supervision of normal first pregnancy, antepartum; Tobacco use during pregnancy, antepartum; and Late prenatal care on their problem list.  Patient reports no complaints. Reports fetal movement. Denies any contractions, bleeding or leaking of fluid.   The following portions of the patient's history were reviewed and updated as appropriate: allergies, current medications, past family history, past medical history, past social history, past surgical history and problem list.   Objective:   General:  Alert, oriented and cooperative.   Mental Status: Normal mood and affect perceived. Normal judgment and thought content.  Rest of physical exam deferred due to type of encounter  Assessment and Plan:  Pregnancy: G1P0 at [redacted]w[redacted]d 1. Supervision of normal first pregnancy, antepartum -BP 109/73. Reviewed BPs in Baby[facility] and all within normal limits -Reviewed labs from last visit -Anticipatory guidance of next visit reviewed with patient including GBS testing  Preterm labor symptoms and general obstetric precautions including but not limited to vaginal bleeding, contractions, leaking of fluid and fetal movement were reviewed in detail with the patient.  I discussed  the assessment and treatment plan with the patient. The patient was provided an opportunity to ask questions and all were answered. The patient agreed with the plan and demonstrated an understanding of the instructions. The patient was advised to call back or seek an in-person office evaluation/go to MAU at [facility] for any urgent or concerning symptoms. Please refer to After Visit Summary for other counseling recommendations.   I provided 10 minutes of non-face-to-face time during this encounter.  Return in about 6 weeks (around 07/22/2019) for Return OB visit/GBS .  No future appointments.  Wende Mott, [place] for [employer], [place]

## 2019-06-14 ENCOUNTER — Telehealth: Payer: Self-pay | Admitting: *Deleted

## 2019-06-14 NOTE — Telephone Encounter (Signed)
Patient called on 06/13/19 at 1:45pm and left a message about having vaginal bleeding, sent a MyChart message also. RN advised to call and schedule an appointment via [place]. Patient was called to schedule on 06/14/19 but does not have transportation and will wait until Friday, July 17th. Patient was advised of risk of waiting and if she has an emergency that she needs to be seen in MAU. Patient agreed.

## 2019-06-17 ENCOUNTER — Other Ambulatory Visit: Payer: Self-pay

## 2019-06-17 ENCOUNTER — Ambulatory Visit (INDEPENDENT_AMBULATORY_CARE_PROVIDER_SITE_OTHER): Payer: Medicaid Other | Admitting: Certified Nurse Midwife

## 2019-06-17 ENCOUNTER — Encounter: Payer: Self-pay | Admitting: Certified Nurse Midwife

## 2019-06-17 VITALS — BP 96/64 | HR 68 | Wt 126.0 lb

## 2019-06-17 DIAGNOSIS — M549 Dorsalgia, unspecified: Secondary | ICD-10-CM

## 2019-06-17 DIAGNOSIS — O9989 Other specified diseases and conditions complicating pregnancy, childbirth and the puerperium: Secondary | ICD-10-CM

## 2019-06-17 DIAGNOSIS — R102 Pelvic and perineal pain: Secondary | ICD-10-CM

## 2019-06-17 DIAGNOSIS — Z3A30 30 weeks gestation of pregnancy: Secondary | ICD-10-CM

## 2019-06-17 DIAGNOSIS — Z3400 Encounter for supervision of normal first pregnancy, unspecified trimester: Secondary | ICD-10-CM

## 2019-06-17 DIAGNOSIS — O26899 Other specified pregnancy related conditions, unspecified trimester: Secondary | ICD-10-CM

## 2019-06-17 DIAGNOSIS — O26893 Other specified pregnancy related conditions, third trimester: Secondary | ICD-10-CM

## 2019-06-17 DIAGNOSIS — R12 Heartburn: Secondary | ICD-10-CM

## 2019-06-17 DIAGNOSIS — R51 Headache: Secondary | ICD-10-CM

## 2019-06-17 DIAGNOSIS — R519 Headache, unspecified: Secondary | ICD-10-CM

## 2019-06-17 MED ORDER — COMFORT FIT MATERNITY SUPP MED MISC: 1.0000 | Freq: Every day | 0 refills | Status: DC

## 2019-06-17 MED ORDER — CYCLOBENZAPRINE HCL 10 MG PO TABS: 10.0000 mg | ORAL_TABLET | Freq: Three times a day (TID) | ORAL | 0 refills | Status: DC | PRN

## 2019-06-17 MED ORDER — FAMOTIDINE 20 MG PO TABS: 20.0000 mg | ORAL_TABLET | Freq: Every day | ORAL | 1 refills | Status: DC

## 2019-06-17 MED ORDER — MAGNESIUM OXIDE -MG SUPPLEMENT 200 MG PO TABS: 200.0000 mg | ORAL_TABLET | Freq: Every day | ORAL | 1 refills | Status: DC

## 2019-06-17 NOTE — Patient Instructions (Signed)

## 2019-06-17 NOTE — Progress Notes (Signed)
Subjective:  Kelly Allen is a 23 y.o. G1P0 at [redacted]w[redacted]d being seen today for ongoing prenatal care.  She is currently monitored for the following issues for this low-risk pregnancy and has Supervision of normal first pregnancy, antepartum; Tobacco use during pregnancy, antepartum; and Late prenatal care on their problem list.  Patient reports low abd/pelvic pressure for a few days. Denies VB, LOF, or ctx. Has occasional BH ctx. Denies urinary sx. No recent IC. Reports a few drops of red blood 5 days ago, none since. C/o LBP, mostly at night and when she is standing.  Contractions: Not present. Vag. Bleeding: None.  Movement: Present. Denies leaking of fluid.   The following portions of the patient's history were reviewed and updated as appropriate: allergies, current medications, past family history, past medical history, past social history, past surgical history and problem list. Problem list updated.  Objective:   Vitals:   06/17/19 0929  BP: 96/64  Pulse: 68  Weight: 126 lb (57.2 kg)    Fetal Status: Fetal Heart Rate (bpm): 141 Fundal Height: 30 cm Movement: Present     General:  Alert, oriented and cooperative. Patient is in no acute distress.  Skin: Skin is warm and dry. No rash noted.   Cardiovascular: Normal heart rate noted  Respiratory: Normal respiratory effort, no problems with respiration noted  Abdomen: Soft, gravid, appropriate for gestational age. Pain/Pressure: Present     Pelvic: Vag. Bleeding: None Vag D/C Character: Thin   Cervical exam performed Dilation: Closed Effacement (%): 0    Extremities: Normal range of motion.  Edema: None  Mental Status: Normal mood and affect. Normal behavior. Normal judgment and thought content.   Urinalysis:      Assessment and Plan:  Pregnancy: G1P0 at [redacted]w[redacted]d  1. Supervision of normal first pregnancy, antepartum  2. Back pain affecting pregnancy in third trimester - tubs soaks, heating pad - cyclobenzaprine (FLEXERIL) 10 MG tablet;  Take 1 tablet (10 mg total) by mouth 3 (three) times daily as needed (back pain).  Dispense: 30 tablet; Refill: 0  3. Pregnancy headache in third trimester - reports frequent HA, denies visual disturbances, has tried Tylenol but doesn't help - Increase water intake - Magnesium Oxide 200 MG TABS; Take 1 tablet (200 mg total) by mouth daily.  Dispense: 30 tablet; Refill: 1  4. Pelvic pressure in pregnancy - no signs of PTL - tub soaks, heat prn - Elastic Bandages & Supports (COMFORT FIT MATERNITY SUPP MED) MISC; 1 Device by Does not apply route daily.  Dispense: 1 each; Refill: 0  5. Heartburn during pregnancy in third trimester - has tried TUMS, not helping - famotidine (PEPCID) 20 MG tablet; Take 1 tablet (20 mg total) by mouth at bedtime.  Dispense: 60 tablet; Refill: 1  Preterm labor symptoms and general obstetric precautions including but not limited to vaginal bleeding, contractions, leaking of fluid and fetal movement were reviewed in detail with the patient. Please refer to After Visit Summary for other counseling recommendations.  Return in about 4 weeks (around 07/15/2019).- virtual visit   Julianne Handler, CNM

## 2019-06-17 NOTE — Progress Notes (Signed)
Pt c/o bright red bleeding on Sunday 7/12 but, no bleeding since Pt c/o intense pressure

## 2019-07-15 ENCOUNTER — Telehealth (INDEPENDENT_AMBULATORY_CARE_PROVIDER_SITE_OTHER): Payer: Medicaid Other | Admitting: Certified Nurse Midwife

## 2019-07-15 ENCOUNTER — Other Ambulatory Visit: Payer: Self-pay

## 2019-07-15 VITALS — BP 112/71 | HR 72 | Wt 125.0 lb

## 2019-07-15 DIAGNOSIS — O99330 Smoking (tobacco) complicating pregnancy, unspecified trimester: Secondary | ICD-10-CM

## 2019-07-15 DIAGNOSIS — Z3A34 34 weeks gestation of pregnancy: Secondary | ICD-10-CM

## 2019-07-15 DIAGNOSIS — Z3400 Encounter for supervision of normal first pregnancy, unspecified trimester: Secondary | ICD-10-CM

## 2019-07-15 DIAGNOSIS — O99333 Smoking (tobacco) complicating pregnancy, third trimester: Secondary | ICD-10-CM

## 2019-07-15 NOTE — Progress Notes (Addendum)
   [place] VIRTUAL VIDEO VISIT ENCOUNTER NOTE  Provider location: Center for [employer] at [place]   I connected with Leane Para on 07/15/19 at  8:45 AM EDT by MyChart Video Encounter at home and verified that I am speaking with the correct person using two identifiers.   I discussed the limitations, risks, security and privacy concerns of performing an evaluation and management service virtually and the availability of in person appointments. I also discussed with the patient that there may be a patient responsible charge related to this service. The patient expressed understanding and agreed to proceed. Subjective:  Kelly Allen is a 23 y.o. G1P0 at [redacted]w[redacted]d being seen today for ongoing prenatal care.  She is currently monitored for the following issues for this low-risk pregnancy and has Supervision of normal first pregnancy, antepartum; Tobacco use during pregnancy, antepartum; and Late prenatal care on their problem list.  Patient reports Reports losing small pieces of mucous plug. Having occasional BH ctx, 2-3 per week.  Contractions: Not present. Vag. Bleeding: None.  Movement: Present. Denies any leaking of fluid.   The following portions of the patient's history were reviewed and updated as appropriate: allergies, current medications, past family history, past medical history, past social history, past surgical history and problem list.   Objective:   Vitals:   07/15/19 0822  BP: 112/71  Pulse: 72  Weight: 125 lb (56.7 kg)    Fetal Status:     Movement: Present     General:  Alert, oriented and cooperative. Patient is in no acute distress.  Respiratory: Normal respiratory effort, no problems with respiration noted  Mental Status: Normal mood and affect. Normal behavior. Normal judgment and thought content.  Rest of physical exam deferred due to type of encounter  Imaging: No results found.  Assessment and Plan:  Pregnancy: G1P0 at [redacted]w[redacted]d 1.  Tobacco use in pregnancy - using 1-2 cig per day - would like to quit - discussed use of Wellbutrin (150mg  ER daily x3 days then bid), more info sent via [place] - given info for 1-800-quit-now, smokefree.gov  2. Supervision of normal first pregnancy, antepartum - GBS next visit  Preterm labor symptoms and general obstetric precautions including but not limited to vaginal bleeding, contractions, leaking of fluid and fetal movement were reviewed in detail with the patient. I discussed the assessment and treatment plan with the patient. The patient was provided an opportunity to ask questions and all were answered. The patient agreed with the plan and demonstrated an understanding of the instructions. The patient was advised to call back or seek an in-person office evaluation/go to MAU at [facility] for any urgent or concerning symptoms. Please refer to After Visit Summary for other counseling recommendations.   I provided 7 minutes of face-to-face time during this encounter.  Return in about 2 weeks (around 07/29/2019).  Future Appointments  Date Time Provider [place]  07/29/2019  9:45 AM Julianne Handler, CNM [facility]    Julianne Handler, [place] for [employer], [place]

## 2019-07-15 NOTE — Patient Instructions (Signed)

## 2019-07-24 ENCOUNTER — Other Ambulatory Visit: Payer: Self-pay

## 2019-07-24 DIAGNOSIS — O219 Vomiting of pregnancy, unspecified: Secondary | ICD-10-CM

## 2019-07-26 ENCOUNTER — Encounter: Payer: Medicaid Other | Admitting: Certified Nurse Midwife

## 2019-07-29 ENCOUNTER — Other Ambulatory Visit (HOSPITAL_COMMUNITY)
Admission: RE | Admit: 2019-07-29 | Discharge: 2019-07-29 | Disposition: A | Discharge status: Home / Self Care | Payer: Medicaid Other | Source: Ambulatory Visit | Attending: Certified Nurse Midwife | Admitting: Certified Nurse Midwife

## 2019-07-29 ENCOUNTER — Ambulatory Visit (INDEPENDENT_AMBULATORY_CARE_PROVIDER_SITE_OTHER): Payer: Medicaid Other | Admitting: Certified Nurse Midwife

## 2019-07-29 ENCOUNTER — Other Ambulatory Visit: Payer: Self-pay

## 2019-07-29 VITALS — BP 112/70 | HR 72 | Wt 132.0 lb

## 2019-07-29 DIAGNOSIS — O99330 Smoking (tobacco) complicating pregnancy, unspecified trimester: Secondary | ICD-10-CM

## 2019-07-29 DIAGNOSIS — Z3400 Encounter for supervision of normal first pregnancy, unspecified trimester: Secondary | ICD-10-CM | POA: Diagnosis not present

## 2019-07-29 DIAGNOSIS — O219 Vomiting of pregnancy, unspecified: Secondary | ICD-10-CM

## 2019-07-29 DIAGNOSIS — Z3A36 36 weeks gestation of pregnancy: Secondary | ICD-10-CM

## 2019-07-29 DIAGNOSIS — O99333 Smoking (tobacco) complicating pregnancy, third trimester: Secondary | ICD-10-CM

## 2019-07-29 DIAGNOSIS — O210 Mild hyperemesis gravidarum: Secondary | ICD-10-CM

## 2019-07-29 LAB — OB RESULTS CONSOLE GBS: GBS: POSITIVE

## 2019-07-29 MED ORDER — ONDANSETRON 4 MG PO TBDP: 4.0000 mg | ORAL_TABLET | Freq: Three times a day (TID) | ORAL | 0 refills | Status: DC | PRN

## 2019-07-29 NOTE — Addendum Note (Signed)
Addended by: Julianne Handler E on: 07/29/2019 11:11 AM   Modules accepted: Orders

## 2019-07-29 NOTE — Progress Notes (Addendum)
Subjective:  Kelly Allen is a 23 y.o. G1P0 at [redacted]w[redacted]d being seen today for ongoing prenatal care.  She is currently monitored for the following issues for this low-risk pregnancy and has Supervision of normal first pregnancy, antepartum; Tobacco use during pregnancy, antepartum; and Late prenatal care on their problem list.  Patient reports nausea, vomiting and hasn't has since 1st trimester, now bid, hasn't taken anything.  Contractions: Irritability. Vag. Bleeding: None.  Movement: Present. Denies leaking of fluid.   The following portions of the patient's history were reviewed and updated as appropriate: allergies, current medications, past family history, past medical history, past social history, past surgical history and problem list. Problem list updated.  Objective:   Vitals:   07/29/19 0953  BP: 112/70  Pulse: 72  Weight: 132 lb (59.9 kg)    Fetal Status: Fetal Heart Rate (bpm): 146 Fundal Height: 38 cm Movement: Present     General:  Alert, oriented and cooperative. Patient is in no acute distress.  Skin: Skin is warm and dry. No rash noted.   Cardiovascular: Normal heart rate noted  Respiratory: Normal respiratory effort, no problems with respiration noted  Abdomen: Soft, gravid, appropriate for gestational age. Pain/Pressure: Present     Pelvic: Vag. Bleeding: None Vag D/C Character: Mucous   Cervical exam deferred        Extremities: Normal range of motion.  Edema: None  Mental Status: Normal mood and affect. Normal behavior. Normal judgment and thought content.   Urinalysis:      Assessment and Plan:  Pregnancy: G1P0 at [redacted]w[redacted]d  1. Supervision of normal first pregnancy, antepartum - Cervicovaginal ancillary only( [facility]) - Culture, beta strep (group b only)  2. Tobacco use during pregnancy, antepartum - declines Wellbutrin - encouraged to get support from 1-800-quitnow  3. Morning sickness - Rx Zofran  Term labor symptoms and general obstetric precautions  including but not limited to vaginal bleeding, contractions, leaking of fluid and fetal movement were reviewed in detail with the patient. Please refer to After Visit Summary for other counseling recommendations.  Return in about 2 weeks (around 08/12/2019). virtual   Julianne Handler, CNM

## 2019-07-30 LAB — CULTURE, BETA STREP (GROUP B ONLY)
MICRO NUMBER:: 823586
SPECIMEN QUALITY:: ADEQUATE

## 2019-08-01 ENCOUNTER — Encounter: Payer: Self-pay | Admitting: Certified Nurse Midwife

## 2019-08-01 DIAGNOSIS — O99820 Streptococcus B carrier state complicating pregnancy: Secondary | ICD-10-CM | POA: Insufficient documentation

## 2019-08-02 LAB — CERVICOVAGINAL ANCILLARY ONLY
Chlamydia: NEGATIVE
Neisseria Gonorrhea: NEGATIVE

## 2019-08-10 ENCOUNTER — Telehealth: Payer: Self-pay

## 2019-08-10 DIAGNOSIS — R12 Heartburn: Secondary | ICD-10-CM

## 2019-08-10 DIAGNOSIS — O26893 Other specified pregnancy related conditions, third trimester: Secondary | ICD-10-CM

## 2019-08-10 MED ORDER — FAMOTIDINE 20 MG PO TABS: 20.0000 mg | ORAL_TABLET | Freq: Every day | ORAL | 1 refills | Status: DC

## 2019-08-10 NOTE — Telephone Encounter (Signed)
Refill of Famotidine sent per Julianne Handler, CNM

## 2019-08-12 ENCOUNTER — Telehealth (INDEPENDENT_AMBULATORY_CARE_PROVIDER_SITE_OTHER): Payer: Medicaid Other

## 2019-08-12 DIAGNOSIS — O99820 Streptococcus B carrier state complicating pregnancy: Secondary | ICD-10-CM

## 2019-08-12 DIAGNOSIS — Z3400 Encounter for supervision of normal first pregnancy, unspecified trimester: Secondary | ICD-10-CM

## 2019-08-12 DIAGNOSIS — Z3A38 38 weeks gestation of pregnancy: Secondary | ICD-10-CM

## 2019-08-12 NOTE — Progress Notes (Signed)
   TELEHEALTH VIRTUAL OBSTETRICS VISIT ENCOUNTER NOTE  I connected with Kelly Allen on 08/12/19 at  9:15 AM EDT by MyChart Virtual Visit at home and verified that I am speaking with the correct person using two identifiers.   I discussed the limitations, risks, security and privacy concerns of performing an evaluation and management service by telephone and the availability of in person appointments. I also discussed with the patient that there may be a patient responsible charge related to this service. The patient expressed understanding and agreed to proceed.  Subjective:  Kelly Allen is a 23 y.o. G1P0 at [redacted]w[redacted]d being followed for ongoing prenatal care.  She is currently monitored for the following issues for this low-risk pregnancy and has Supervision of normal first pregnancy, antepartum; Tobacco use during pregnancy, antepartum; Late prenatal care; and GBS (group B Streptococcus carrier), +RV culture, currently pregnant on their problem list.  Patient reports no complaints. Reports fetal movement. Denies any contractions, bleeding or leaking of fluid.   The following portions of the patient's history were reviewed and updated as appropriate: allergies, current medications, past family history, past medical history, past social history, past surgical history and problem list.   Objective:   General:  Alert, oriented and cooperative.   Mental Status: Normal mood and affect perceived. Normal judgment and thought content.  Rest of physical exam deferred due to type of encounter  Assessment and Plan:  Pregnancy: G1P0 at [redacted]w[redacted]d 1. GBS (group B Streptococcus carrier), +RV culture, currently pregnant -Discussed abx in labor  2. Supervision of normal first pregnancy, antepartum -BP 107/78 -Counseled patient on routine COVID-19 testing for all admission to inpatient units whether direct admit or from MAU/ED. Reviewed reasons for testing including identifying cases and protecting patients/staff  from possible infection. Reassured patient that separation from newborn is not required for COVID+ tests in asymptomatic patients and that the plan of care will be created with Peds through shared decision-making. One support person is allowed for all admitted patients regardless of COVID test results. All patient questions answered.   -Anticipatory guidance of next visits reviewed with patient  Term labor symptoms and general obstetric precautions including but not limited to vaginal bleeding, contractions, leaking of fluid and fetal movement were reviewed in detail with the patient.  I discussed the assessment and treatment plan with the patient. The patient was provided an opportunity to ask questions and all were answered. The patient agreed with the plan and demonstrated an understanding of the instructions. The patient was advised to call back or seek an in-person office evaluation/go to [facility]U at [facility] for any urgent or concerning symptoms. Please refer to After Visit Summary for other counseling recommendations.   I provided 10 minutes of non-face-to-face time during this encounter.  Return in about 1 week (around 08/19/2019) for Return OB visit.  No future appointments.  Wende Mott, [place] for [employer], [place]

## 2019-08-13 ENCOUNTER — Telehealth: Payer: Medicaid Other

## 2019-08-13 ENCOUNTER — Encounter (HOSPITAL_COMMUNITY): Payer: Self-pay

## 2019-08-13 ENCOUNTER — Other Ambulatory Visit: Payer: Self-pay

## 2019-08-13 ENCOUNTER — Inpatient Hospital Stay (HOSPITAL_COMMUNITY)
Admission: AD | Admit: 2019-08-13 | Discharge: 2019-08-15 | DRG: 768 | Disposition: A | Discharge status: Home / Self Care | Payer: Medicaid Other | Attending: Obstetrics and Gynecology | Admitting: Obstetrics and Gynecology

## 2019-08-13 ENCOUNTER — Inpatient Hospital Stay (HOSPITAL_COMMUNITY): Payer: Medicaid Other | Admitting: Anesthesiology

## 2019-08-13 DIAGNOSIS — Z3A38 38 weeks gestation of pregnancy: Secondary | ICD-10-CM

## 2019-08-13 DIAGNOSIS — F17210 Nicotine dependence, cigarettes, uncomplicated: Secondary | ICD-10-CM | POA: Diagnosis present

## 2019-08-13 DIAGNOSIS — O99334 Smoking (tobacco) complicating childbirth: Secondary | ICD-10-CM | POA: Diagnosis present

## 2019-08-13 DIAGNOSIS — O99824 Streptococcus B carrier state complicating childbirth: Secondary | ICD-10-CM | POA: Diagnosis present

## 2019-08-13 DIAGNOSIS — O99820 Streptococcus B carrier state complicating pregnancy: Secondary | ICD-10-CM

## 2019-08-13 DIAGNOSIS — Z20828 Contact with and (suspected) exposure to other viral communicable diseases: Secondary | ICD-10-CM | POA: Diagnosis present

## 2019-08-13 DIAGNOSIS — O26893 Other specified pregnancy related conditions, third trimester: Secondary | ICD-10-CM | POA: Diagnosis present

## 2019-08-13 DIAGNOSIS — Z3400 Encounter for supervision of normal first pregnancy, unspecified trimester: Secondary | ICD-10-CM

## 2019-08-13 DIAGNOSIS — Z3A39 39 weeks gestation of pregnancy: Secondary | ICD-10-CM

## 2019-08-13 LAB — URINALYSIS, ROUTINE W REFLEX MICROSCOPIC
Bilirubin Urine: NEGATIVE
Glucose, UA: NEGATIVE mg/dL
Hgb urine dipstick: NEGATIVE
Ketones, ur: NEGATIVE mg/dL
Leukocytes,Ua: NEGATIVE
Nitrite: NEGATIVE
Protein, ur: NEGATIVE mg/dL
Specific Gravity, Urine: 1.014 (ref 1.005–1.030)
pH: 5 (ref 5.0–8.0)

## 2019-08-13 LAB — CBC
HCT: 36.7 % (ref 36.0–46.0)
HCT: 31.9 % — ABNORMAL LOW (ref 36.0–46.0)
Hemoglobin: 12.4 g/dL (ref 12.0–15.0)
Hemoglobin: 10.9 g/dL — ABNORMAL LOW (ref 12.0–15.0)
MCH: 31.6 pg (ref 26.0–34.0)
MCH: 31.6 pg (ref 26.0–34.0)
MCHC: 33.8 g/dL (ref 30.0–36.0)
MCHC: 34.2 g/dL (ref 30.0–36.0)
MCV: 93.4 fL (ref 80.0–100.0)
MCV: 92.5 fL (ref 80.0–100.0)
Platelets: 221 10*3/uL (ref 150–400)
Platelets: 218 10*3/uL (ref 150–400)
RBC: 3.93 MIL/uL (ref 3.87–5.11)
RBC: 3.45 MIL/uL — ABNORMAL LOW (ref 3.87–5.11)
RDW: 12.7 % (ref 11.5–15.5)
RDW: 12.9 % (ref 11.5–15.5)
WBC: 12 10*3/uL — ABNORMAL HIGH (ref 4.0–10.5)
WBC: 19 10*3/uL — ABNORMAL HIGH (ref 4.0–10.5)
nRBC: 0 % (ref 0.0–0.2)
nRBC: 0 % (ref 0.0–0.2)

## 2019-08-13 LAB — ABO/RH: ABO/RH(D): O POS

## 2019-08-13 LAB — POCT FERN TEST: POCT Fern Test: POSITIVE

## 2019-08-13 LAB — SARS CORONAVIRUS 2 BY RT PCR (HOSPITAL ORDER, PERFORMED IN ~~LOC~~ HOSPITAL LAB): SARS Coronavirus 2: NEGATIVE

## 2019-08-13 LAB — TYPE AND SCREEN
ABO/RH(D): O POS
Antibody Screen: NEGATIVE

## 2019-08-13 LAB — RPR: RPR Ser Ql: NONREACTIVE

## 2019-08-13 MED ORDER — ONDANSETRON HCL 4 MG/2ML IJ SOLN
4.0000 mg | Freq: Four times a day (QID) | INTRAMUSCULAR | Status: DC | PRN
  Administered 2019-08-13 (×2): 4 mg via INTRAVENOUS
  Filled 2019-08-13 (×3): qty 2

## 2019-08-13 MED ORDER — SODIUM CHLORIDE 0.9 % IV SOLN: 2.0000 g | Freq: Four times a day (QID) | INTRAVENOUS | Status: DC

## 2019-08-13 MED ORDER — FENTANYL-BUPIVACAINE-NACL 0.5-0.125-0.9 MG/250ML-% EP SOLN: 12.0000 mL/h | EPIDURAL | Status: DC | PRN

## 2019-08-13 MED ORDER — FENTANYL CITRATE (PF) 100 MCG/2ML IJ SOLN
100.0000 ug | Freq: Once | INTRAMUSCULAR | Status: AC
  Administered 2019-08-13: 100 ug via EPIDURAL

## 2019-08-13 MED ORDER — TETANUS-DIPHTH-ACELL PERTUSSIS 5-2.5-18.5 LF-MCG/0.5 IM SUSP: 0.5000 mL | Freq: Once | INTRAMUSCULAR | Status: DC

## 2019-08-13 MED ORDER — SODIUM CHLORIDE (PF) 0.9 % IJ SOLN
INTRAMUSCULAR | Status: DC | PRN
  Administered 2019-08-13: 12 mL/h via EPIDURAL

## 2019-08-13 MED ORDER — PHENYLEPHRINE 40 MCG/ML (10ML) SYRINGE FOR IV PUSH (FOR BLOOD PRESSURE SUPPORT)
80.0000 ug | PREFILLED_SYRINGE | INTRAVENOUS | Status: DC | PRN
  Filled 2019-08-13: qty 10

## 2019-08-13 MED ORDER — PRENATAL MULTIVITAMIN CH
1.0000 | ORAL_TABLET | Freq: Every day | ORAL | Status: DC
  Administered 2019-08-14: 1 via ORAL
  Filled 2019-08-13: qty 1

## 2019-08-13 MED ORDER — ONDANSETRON HCL 4 MG PO TABS: 4.0000 mg | ORAL_TABLET | ORAL | Status: DC | PRN

## 2019-08-13 MED ORDER — SODIUM CHLORIDE 0.9 % IV BOLUS
1000.0000 mL | Freq: Once | INTRAVENOUS | Status: AC
  Administered 2019-08-13: 1000 mL via INTRAVENOUS

## 2019-08-13 MED ORDER — PENICILLIN G 3 MILLION UNITS IVPB - SIMPLE MED
3.0000 10*6.[IU] | INTRAVENOUS | Status: DC
  Administered 2019-08-13 (×2): 3 10*6.[IU] via INTRAVENOUS
  Filled 2019-08-13 (×2): qty 100

## 2019-08-13 MED ORDER — PROMETHAZINE HCL 25 MG/ML IJ SOLN
12.5000 mg | Freq: Four times a day (QID) | INTRAMUSCULAR | Status: DC | PRN
  Administered 2019-08-13: 12.5 mg via INTRAVENOUS
  Filled 2019-08-13: qty 1

## 2019-08-13 MED ORDER — DIPHENHYDRAMINE HCL 50 MG/ML IJ SOLN: 12.5000 mg | INTRAMUSCULAR | Status: DC | PRN

## 2019-08-13 MED ORDER — BUPIVACAINE HCL (PF) 0.25 % IJ SOLN
INTRAMUSCULAR | Status: DC | PRN
  Administered 2019-08-13 (×4): 5 mL via EPIDURAL

## 2019-08-13 MED ORDER — SODIUM CHLORIDE 0.9 % IV SOLN
3.0000 g | Freq: Four times a day (QID) | INTRAVENOUS | Status: AC
  Administered 2019-08-14 (×3): 3 g via INTRAVENOUS
  Filled 2019-08-13: qty 8
  Filled 2019-08-13: qty 3
  Filled 2019-08-13 (×2): qty 8

## 2019-08-13 MED ORDER — SIMETHICONE 80 MG PO CHEW: 80.0000 mg | CHEWABLE_TABLET | ORAL | Status: DC | PRN

## 2019-08-13 MED ORDER — FLEET ENEMA 7-19 GM/118ML RE ENEM: 1.0000 | ENEMA | RECTAL | Status: DC | PRN

## 2019-08-13 MED ORDER — BENZOCAINE-MENTHOL 20-0.5 % EX AERO
1.0000 "application " | INHALATION_SPRAY | CUTANEOUS | Status: DC | PRN
  Administered 2019-08-14: 1 via TOPICAL
  Filled 2019-08-13: qty 56

## 2019-08-13 MED ORDER — FENTANYL-BUPIVACAINE-NACL 0.5-0.125-0.9 MG/250ML-% EP SOLN
12.0000 mL/h | EPIDURAL | Status: DC | PRN
  Filled 2019-08-13: qty 250

## 2019-08-13 MED ORDER — METHYLERGONOVINE MALEATE 0.2 MG/ML IJ SOLN
INTRAMUSCULAR | Status: AC
  Filled 2019-08-13: qty 1

## 2019-08-13 MED ORDER — WITCH HAZEL-GLYCERIN EX PADS: 1.0000 "application " | MEDICATED_PAD | CUTANEOUS | Status: DC | PRN

## 2019-08-13 MED ORDER — CARBOPROST TROMETHAMINE 250 MCG/ML IM SOLN
250.0000 ug | Freq: Once | INTRAMUSCULAR | Status: AC
  Administered 2019-08-13: 250 ug via INTRAMUSCULAR

## 2019-08-13 MED ORDER — ONDANSETRON HCL 4 MG/2ML IJ SOLN: 4.0000 mg | INTRAMUSCULAR | Status: DC | PRN

## 2019-08-13 MED ORDER — FENTANYL CITRATE (PF) 100 MCG/2ML IJ SOLN
50.0000 ug | Freq: Once | INTRAMUSCULAR | Status: AC
  Administered 2019-08-13: 50 ug via INTRAVENOUS

## 2019-08-13 MED ORDER — TRANEXAMIC ACID-NACL 1000-0.7 MG/100ML-% IV SOLN
1000.0000 mg | INTRAVENOUS | Status: AC
  Administered 2019-08-13: 1000 mg via INTRAVENOUS
  Filled 2019-08-13: qty 100

## 2019-08-13 MED ORDER — OXYCODONE-ACETAMINOPHEN 5-325 MG PO TABS: 2.0000 | ORAL_TABLET | ORAL | Status: DC | PRN

## 2019-08-13 MED ORDER — CARBOPROST TROMETHAMINE 250 MCG/ML IM SOLN
INTRAMUSCULAR | Status: AC
  Filled 2019-08-13: qty 1

## 2019-08-13 MED ORDER — ZOLPIDEM TARTRATE 5 MG PO TABS: 5.0000 mg | ORAL_TABLET | Freq: Every evening | ORAL | Status: DC | PRN

## 2019-08-13 MED ORDER — OXYTOCIN 40 UNITS IN NORMAL SALINE INFUSION - SIMPLE MED
2.5000 [IU]/h | INTRAVENOUS | Status: DC
  Filled 2019-08-13 (×2): qty 1000

## 2019-08-13 MED ORDER — IBUPROFEN 600 MG PO TABS
600.0000 mg | ORAL_TABLET | Freq: Four times a day (QID) | ORAL | Status: DC
  Administered 2019-08-14 (×5): 600 mg via ORAL
  Filled 2019-08-13 (×5): qty 1

## 2019-08-13 MED ORDER — MISOPROSTOL 50MCG HALF TABLET
50.0000 ug | ORAL_TABLET | ORAL | Status: DC
  Administered 2019-08-13: 50 ug via BUCCAL
  Filled 2019-08-13: qty 1

## 2019-08-13 MED ORDER — MISOPROSTOL 200 MCG PO TABS
ORAL_TABLET | ORAL | Status: AC
  Filled 2019-08-13: qty 4

## 2019-08-13 MED ORDER — LACTATED RINGERS IV SOLN
500.0000 mL | Freq: Once | INTRAVENOUS | Status: AC
  Administered 2019-08-13: 500 mL via INTRAVENOUS

## 2019-08-13 MED ORDER — LIDOCAINE HCL (PF) 1 % IJ SOLN: 30.0000 mL | INTRAMUSCULAR | Status: DC | PRN

## 2019-08-13 MED ORDER — FENTANYL CITRATE (PF) 100 MCG/2ML IJ SOLN
INTRAMUSCULAR | Status: DC | PRN
  Administered 2019-08-13: 50 ug via INTRAVENOUS
  Administered 2019-08-13: 50 ug via EPIDURAL
  Administered 2019-08-13: 50 ug via INTRAVENOUS
  Administered 2019-08-13: 50 ug via EPIDURAL

## 2019-08-13 MED ORDER — ONDANSETRON HCL 4 MG/2ML IJ SOLN
4.0000 mg | Freq: Once | INTRAMUSCULAR | Status: AC
  Administered 2019-08-13: 4 mg via INTRAVENOUS

## 2019-08-13 MED ORDER — TRANEXAMIC ACID-NACL 1000-0.7 MG/100ML-% IV SOLN
INTRAVENOUS | Status: AC
  Administered 2019-08-13: 1000 mg via INTRAVENOUS
  Filled 2019-08-13: qty 100

## 2019-08-13 MED ORDER — SOD CITRATE-CITRIC ACID 500-334 MG/5ML PO SOLN: 30.0000 mL | ORAL | Status: DC | PRN

## 2019-08-13 MED ORDER — LIDOCAINE HCL (PF) 1 % IJ SOLN
INTRAMUSCULAR | Status: DC | PRN
  Administered 2019-08-13 (×2): 5 mL via EPIDURAL

## 2019-08-13 MED ORDER — TRANEXAMIC ACID-NACL 1000-0.7 MG/100ML-% IV SOLN
1000.0000 mg | INTRAVENOUS | Status: AC
  Administered 2019-08-13: 20:00:00 1000 mg via INTRAVENOUS

## 2019-08-13 MED ORDER — GENTAMICIN SULFATE 40 MG/ML IJ SOLN: 1.5000 mg/kg | Freq: Three times a day (TID) | INTRAVENOUS | Status: DC

## 2019-08-13 MED ORDER — ACETAMINOPHEN 325 MG PO TABS: 650.0000 mg | ORAL_TABLET | ORAL | Status: DC | PRN

## 2019-08-13 MED ORDER — LACTATED RINGERS IV SOLN
INTRAVENOUS | Status: DC
  Administered 2019-08-13 (×2): via INTRAVENOUS

## 2019-08-13 MED ORDER — EPHEDRINE 5 MG/ML INJ: 10.0000 mg | INTRAVENOUS | Status: DC | PRN

## 2019-08-13 MED ORDER — DIPHENOXYLATE-ATROPINE 2.5-0.025 MG PO TABS
2.0000 | ORAL_TABLET | Freq: Once | ORAL | Status: DC
  Administered 2019-08-13: 2 via ORAL
  Filled 2019-08-13: qty 2

## 2019-08-13 MED ORDER — MISOPROSTOL 200 MCG PO TABS
800.0000 ug | ORAL_TABLET | Freq: Once | ORAL | Status: AC
  Administered 2019-08-13: 800 ug via RECTAL

## 2019-08-13 MED ORDER — DIBUCAINE (PERIANAL) 1 % EX OINT: 1.0000 "application " | TOPICAL_OINTMENT | CUTANEOUS | Status: DC | PRN

## 2019-08-13 MED ORDER — COCONUT OIL OIL
1.0000 "application " | TOPICAL_OIL | Status: DC | PRN
  Administered 2019-08-14: 1 via TOPICAL

## 2019-08-13 MED ORDER — SODIUM CHLORIDE 0.9 % IV SOLN
5.0000 10*6.[IU] | Freq: Once | INTRAVENOUS | Status: AC
  Administered 2019-08-13: 5 10*6.[IU] via INTRAVENOUS
  Filled 2019-08-13: qty 5

## 2019-08-13 MED ORDER — OXYCODONE-ACETAMINOPHEN 5-325 MG PO TABS: 1.0000 | ORAL_TABLET | ORAL | Status: DC | PRN

## 2019-08-13 MED ORDER — OXYTOCIN BOLUS FROM INFUSION
500.0000 mL | Freq: Once | INTRAVENOUS | Status: AC
  Administered 2019-08-13: 500 mL via INTRAVENOUS

## 2019-08-13 MED ORDER — METHYLERGONOVINE MALEATE 0.2 MG/ML IJ SOLN
0.2000 mg | Freq: Once | INTRAMUSCULAR | Status: AC
  Administered 2019-08-13: 0.2 mg via INTRAMUSCULAR

## 2019-08-13 MED ORDER — FENTANYL CITRATE (PF) 100 MCG/2ML IJ SOLN
INTRAMUSCULAR | Status: AC
  Filled 2019-08-13: qty 2

## 2019-08-13 MED ORDER — DIPHENHYDRAMINE HCL 25 MG PO CAPS: 25.0000 mg | ORAL_CAPSULE | Freq: Four times a day (QID) | ORAL | Status: DC | PRN

## 2019-08-13 MED ORDER — LACTATED RINGERS IV SOLN
500.0000 mL | INTRAVENOUS | Status: DC | PRN
  Administered 2019-08-13: 500 mL via INTRAVENOUS

## 2019-08-13 MED ORDER — GENTAMICIN SULFATE 40 MG/ML IJ SOLN: 2.0000 mg/kg | Freq: Once | INTRAVENOUS | Status: DC

## 2019-08-13 MED ORDER — FENTANYL CITRATE (PF) 100 MCG/2ML IJ SOLN
100.0000 ug | INTRAMUSCULAR | Status: DC | PRN
  Administered 2019-08-13: 100 ug via INTRAVENOUS
  Filled 2019-08-13 (×3): qty 2

## 2019-08-13 MED ORDER — PHENYLEPHRINE 40 MCG/ML (10ML) SYRINGE FOR IV PUSH (FOR BLOOD PRESSURE SUPPORT): 80.0000 ug | PREFILLED_SYRINGE | INTRAVENOUS | Status: DC | PRN

## 2019-08-13 MED ORDER — SENNOSIDES-DOCUSATE SODIUM 8.6-50 MG PO TABS: 2.0000 | ORAL_TABLET | ORAL | Status: DC

## 2019-08-13 NOTE — H&P (Signed)
OBSTETRIC ADMISSION HISTORY AND PHYSICAL  Kelly Allen is a 23 y.o. female G1P0 with IUP at [redacted]w[redacted]d by last menstrual period presenting for SROM.   Patient admitted from MAU after arriving around 8:30 due to SROM. She notes the fluid was clear without tinges of blood or meconium. She did not feel contractions at time of initial presentation but does endorse contractions at the time of our evaluation. Denies vaginal bleeding. Endorses fetal movement.   This is her first pregnancy. She started receiving her prenatal care at [facility] beginning at [redacted]w[redacted]d. She describes her prenatal course as uneventful noting headaches during third trimester treated succesfully with magnesium and tobacco use throughout her pregnancy. She denies recent headaches, lightheadedness, shortness of breath, chest pain, right upper quadrant pain or leg swelling.   Patient plans to breast feed her baby girl. For contraception, patient would like to use OCPs due to severe migraines with prior use of Depo injection.  Support person in labor: FOB Marco Collie . Anatomy U/S: Normal (05/05/2019)  Prenatal History/Complications: . First pregnancy . Late prenatal care - start at [redacted]w[redacted]d . Tobacco use during pregnancy - (1-2 cigarettes per day)  Past Medical History: Past Medical History:  Diagnosis Date  . Acid reflux   . IBS (irritable bowel syndrome)     Past Surgical History: Past Surgical History:  Procedure Laterality Date  . CHOLECYSTECTOMY    . WISDOM TOOTH EXTRACTION      Obstetrical History: OB History    Gravida  1   Para      Term      Preterm      AB      Living        SAB      TAB      Ectopic      Multiple      Live Births              Social History: Social History   Socioeconomic History  . Marital status: Single    Spouse name: Not on file  . Number of children: Not on file  . Years of education: Not on file  . Highest education level: Not on file  Occupational History   . Occupation: unemployed  Social Needs  . Financial resource strain: Not on file  . Food insecurity    Worry: Not on file    Inability: Not on file  . Transportation needs    Medical: Not on file    Non-medical: Not on file  Tobacco Use  . Smoking status: Current Every Day Smoker    Packs/day: 0.25    Years: 7.00    Pack years: 1.75    Types: Cigarettes  . Smokeless tobacco: Never Used  Substance and Sexual Activity  . Alcohol use: Not Currently  . Drug use: Not Currently  . Sexual activity: Yes    Partners: Male    Birth control/protection: None  Lifestyle  . Physical activity    Days per week: Not on file    Minutes per session: Not on file  . Stress: Not on file  Relationships  . Social Musician on phone: Not on file    Gets together: Not on file    Attends religious service: Not on file    Active member of club or organization: Not on file    Attends meetings of clubs or organizations: Not on file    Relationship status: Not on file  Other Topics  Concern  . Not on file  Social History Narrative  . Not on file    Family History: Family History  Problem Relation Age of Onset  . Diabetes Mother   . Asthma Father   . COPD Father   . Cancer Paternal Grandmother        stomach    Allergies: No Known Allergies  Medications Prior to Admission  Medication Sig Dispense Refill Last Dose  . cyclobenzaprine (FLEXERIL) 10 MG tablet Take 1 tablet (10 mg total) by mouth 3 (three) times daily as needed (back pain). (Patient not taking: Reported on 07/29/2019) 30 tablet 0   . Elastic Bandages & Supports (COMFORT FIT MATERNITY SUPP MED) MISC 1 Device by Does not apply route daily. (Patient not taking: Reported on 07/29/2019) 1 each 0   . famotidine (PEPCID) 20 MG tablet Take 1 tablet (20 mg total) by mouth at bedtime. 60 tablet 1   . Magnesium Oxide 200 MG TABS Take 1 tablet (200 mg total) by mouth daily. (Patient not taking: Reported on 07/15/2019) 30 tablet 1    . ondansetron (ZOFRAN ODT) 4 MG disintegrating tablet Take 1 tablet (4 mg total) by mouth every 8 (eight) hours as needed for nausea or vomiting. 30 tablet 0   . Prenatal Vit-Fe Fumarate-FA (PRENATAL VITAMINS PO) Take by mouth.      Review of Systems  All systems reviewed and negative except as stated in HPI  Blood pressure 120/75, pulse 77, temperature 98.4 F (36.9 C), temperature source Oral, resp. rate 18, last menstrual period 10/31/2018, SpO2 95 %. General appearance: alert, cooperative, appears stated age and no distress Lungs: no respiratory distress Heart: regular rate  Abdomen: soft, non-tender; gravid abdomen Extremities: Homans sign is negative, no sign of DVT Presentation: cephalic  (05/23/2019) Fetal monitoringBaseline: 130 bpm, Variability: Good {> 6 bpm) and Accelerations: Reactive Uterine activityDate/time of onset: 9:30a, Frequency: Every 5-6 minutes and Duration: 30-60 seconds Dilation: 2 Effacement (%): 70 Station: -2 Exam by:: Dr. Kennieth Radriver  Prenatal labs: ABO, Rh: O/RH(D) POSITIVE/-- (04/14 1053) Antibody: NO ANTIBODIES DETECTED (04/14 1053) Rubella: 3.33 (04/14 1053) RPR: NON-REACTIVE (07/02 0836)  HBsAg: NON-REACTIVE (04/14 1053)  HIV: NON-REACTIVE (07/02 0836)  GBS:   Positive ( 07/29/2019) Glucola: 102 (06/02/2019) Genetic screening:  NIPS: normal; AFP: negative; SMA: negative; CF: negative  Prenatal Transfer Tool  Maternal Diabetes: No Genetic Screening: Normal Maternal Ultrasounds/Referrals: Normal Fetal Ultrasounds or other Referrals:  Referred to Materal Fetal Medicine  Maternal Substance Abuse:  Yes:  Type: Smoker Significant Maternal Medications:  None Significant Maternal Lab Results: Group B Strep positive  Results for orders placed or performed during the hospital encounter of 08/13/19 (from the past 24 hour(s))  Urinalysis, Routine w reflex microscopic   Collection Time: 08/13/19  8:52 AM  Result Value Ref Range   Color, Urine YELLOW  YELLOW   APPearance CLEAR CLEAR   Specific Gravity, Urine 1.014 1.005 - 1.030   pH 5.0 5.0 - 8.0   Glucose, UA NEGATIVE NEGATIVE mg/dL   Hgb urine dipstick NEGATIVE NEGATIVE   Bilirubin Urine NEGATIVE NEGATIVE   Ketones, ur NEGATIVE NEGATIVE mg/dL   Protein, ur NEGATIVE NEGATIVE mg/dL   Nitrite NEGATIVE NEGATIVE   Leukocytes,Ua NEGATIVE NEGATIVE    Patient Active Problem List   Diagnosis Date Noted  . Indication for care in labor or delivery 08/13/2019  . GBS (group B Streptococcus carrier), +RV culture, currently pregnant 08/01/2019  . Supervision of normal first pregnancy, antepartum 03/15/2019  . Tobacco use  during pregnancy, antepartum 03/15/2019  . Late prenatal care 03/15/2019    Assessment/Plan:  Haydan Wedig is a 23 y.o. G1P0 at [redacted]w[redacted]d here for SROM.   Labor: Dilation: 2, Effacement (%): 70, Station: -2. Patient is having contractions every 5-6 minutes. -- pain control: PRN tylenol, fentanyl, oxy; Epidural offered   Fetal Wellbeing: Cephalic by vaginal exam.  -- GBS positive; currently tx w/IV penicillin  -- continuous fetal monitoring - FMB 130 BPM; good variability, reactive accelerations   Postpartum Planning -- breast feeding  -- Tdap immunization (05/11/2019)  Maudry Diego, MS3  I confirm that I have verified the information documented in the medical student's note and that I have also personally reperformed the history, physical exam and all medical decision making activities of this service and have verified that all service and findings are accurately documented in this student's note.   -Discussed with patient expectant management vs. Augmentation for labor management. Patient desires active management. Discussed with patient placement of foley balloon for cervical ripening. Risks and benefits reviewed. Patient agreeable to plan of care. Foley balloon inserted without difficulty and inflated with 60cc sterile water. Patient tolerated procedure well.  Will give  buccal cytotec. Orders for epidural/ IV pain medication available per patient request.   Wende Mott, CNM 08/13/2019 11:28 AM

## 2019-08-13 NOTE — Progress Notes (Signed)
Called by RN to evaluate patient. Patient reporting a lot of pressure and pain in vagina, she also continues to vomit repeatedly.  Vitals:   08/13/19 2121 08/13/19 2127 08/13/19 2132 08/13/19 2136  BP: (!) 132/58 117/70 115/72 (!) 122/95  Pulse: (!) 105 91 91 (!) 155  Resp:      Temp:      TempSrc:      SpO2:    97%  Weight:      Height:      Vaginal exam: Bakri partially in lower uterine segment; about ~ 100 mL of saline removed and Bakri pushed higher into uterus  - Patient given 12.5 mg Phenergan as she continues to vomit repeatedly; 1 L LR to be given once current liter NS finishes - Patient felt pain relief with release of some fluid from Bakri and still appears to be firmly in place; will monitor bleeding closely and re-inflate as needed  Barrington Ellison, MD OB Family Medicine Fellow, [facility] for [employer], [place]

## 2019-08-13 NOTE — MAU Note (Signed)
Pt reports 1 episode of leaking fluid this morning around 0530.  Pt reports having sex 1 hour prior to LOF.  Pt denies any pain at this time and reports good fetal movement.

## 2019-08-13 NOTE — Progress Notes (Signed)
08/13/2019 - 1:06 PM  23 y.o. G1P0 [redacted]w[redacted]d   Pregnancy complicated by GBS +, Tobacco use in pregnancy  Patient Active Problem List   Diagnosis Date Noted  . Indication for care in labor or delivery 08/13/2019  . GBS (group B Streptococcus carrier), +RV culture, currently pregnant 08/01/2019  . Supervision of normal first pregnancy, antepartum 03/15/2019  . Tobacco use during pregnancy, antepartum 03/15/2019  . Late prenatal care 03/15/2019    [name] is admitted for SROM, SOL   Subjective:  Doing well, received epidural. No needs identified at this time. Objective:   Vitals:   08/13/19 1210 08/13/19 1215 08/13/19 1220 08/13/19 1230  BP: (!) 115/52 122/64 120/63 116/73  Pulse: 64 (!) 46 78 62  Resp:      Temp:      TempSrc:      SpO2: 100% 100% 99%   Weight:      Height:        Current Vital Signs 24h Vital Sign Ranges  T 98.7 F (37.1 C) Temp  Avg: 98.6 F (37 C)  Min: 98.4 F (36.9 C)  Max: 98.7 F (37.1 C)  BP 116/73 BP  Min: 114/69  Max: 129/67  HR 62 Pulse  Avg: 65.1  Min: 46  Max: 78  RR 18 Resp  Avg: 17.5  Min: 14  Max: 20  SaO2 99 %   SpO2  Avg: 98.2 %  Min: 95 %  Max: 100 %       24 Hour I/O Current Shift I/O  Time Ins Outs No intake/output data recorded. No intake/output data recorded.   FHR: 130 baseline, good variability, + accels, no decels.  Toco: q1-2 mins SVE: 5-6/90/-1 per RN   Assessment & Plan:  FHT CAT 1 GBS: +. PCN ppx Pitocin none Analgesia: epidural

## 2019-08-13 NOTE — Anesthesia Procedure Notes (Signed)
Epidural Patient location during procedure: OB Start time: 08/13/2019 11:43 AM End time: 08/13/2019 12:04 PM  Staffing Anesthesiologist: Duane Boston, MD Performed: anesthesiologist   Preanesthetic Checklist Completed: patient identified, site marked, pre-op evaluation, timeout performed, IV checked, risks and benefits discussed and monitors and equipment checked  Epidural Patient position: sitting Prep: DuraPrep Patient monitoring: heart rate, cardiac monitor, continuous pulse ox and blood pressure Approach: midline Location: L2-L3 Injection technique: LOR saline  Needle:  Needle type: Tuohy  Needle gauge: 17 G Needle length: 9 cm Needle insertion depth: 5 cm Catheter size: 20 Guage Catheter at skin depth: 10 cm Test dose: negative and Other  Assessment Events: blood not aspirated, injection not painful, no injection resistance and negative IV test  Additional Notes Informed consent obtained prior to proceeding including risk of failure, 1% risk of PDPH, risk of minor discomfort and bruising.  Discussed rare but serious complications including epidural abscess, permanent nerve injury, epidural hematoma.  Discussed alternatives to epidural analgesia and patient desires to proceed.  Timeout performed pre-procedure verifying patient name, procedure, and platelet count.  Patient tolerated procedure well.

## 2019-08-13 NOTE — Anesthesia Preprocedure Evaluation (Signed)
Anesthesia Evaluation  Patient identified by MRN, date of birth, ID band Patient awake    Reviewed: Allergy & Precautions, NPO status , Patient's Chart, lab work & pertinent test results  Airway Mallampati: II  TM Distance: >3 FB Neck ROM: Full    Dental no notable dental hx. (+) Dental Advisory Given   Pulmonary Current Smoker,    Pulmonary exam normal        Cardiovascular negative cardio ROS Normal cardiovascular exam     Neuro/Psych negative neurological ROS  negative psych ROS   GI/Hepatic Neg liver ROS, GERD  ,  Endo/Other  negative endocrine ROS  Renal/GU negative Renal ROS  negative genitourinary   Musculoskeletal negative musculoskeletal ROS (+)   Abdominal   Peds negative pediatric ROS (+)  Hematology negative hematology ROS (+)   Anesthesia Other Findings   Reproductive/Obstetrics (+) Pregnancy                             Anesthesia Physical Anesthesia Plan  ASA: II  Anesthesia Plan: Epidural   Post-op Pain Management:    Induction:   PONV Risk Score and Plan:   Airway Management Planned: Natural Airway  Additional Equipment:   Intra-op Plan:   Post-operative Plan:   Informed Consent: I have reviewed the patients History and Physical, chart, labs and discussed the procedure including the risks, benefits and alternatives for the proposed anesthesia with the patient or authorized representative who has indicated his/her understanding and acceptance.     Dental advisory given  Plan Discussed with: Anesthesiologist  Anesthesia Plan Comments:         Anesthesia Quick Evaluation

## 2019-08-13 NOTE — Discharge Summary (Signed)
Postpartum Discharge Summary    Patient Name: Kelly Allen DOB: 04/07/1996 MRN: [id number]  Date of admission: 08/13/2019 Delivering Provider: Drinda Butts E   Date of discharge: 08/15/2019  Admitting diagnosis: [place] Pressure Intrauterine pregnancy: [redacted]w[redacted]d    Secondary diagnosis:  Active Problems:   GBS (group B Streptococcus carrier), +RV culture, currently pregnant   Indication for care in labor or delivery   Postpartum hemorrhage, postpartum condition  Additional problems: n/a     Discharge diagnosis: Term Pregnancy Del[place] and [place]                                                                                               Post partum procedures:Feraheme given  Augmentation: Cytotec and Foley Balloon  Complications: None  Hospital course:  Onset of Labor With Vaginal Delivery     23y.o. yo G1P0 at 21w6das admitted in Latent Labor on 08/13/2019. Patient had an uncomplicated labor course as follows:  Membrane Rupture Time/Date: 5:30 AM ,08/13/2019  Patient was given FB with one cytotec, progressed to complete and pushed 30 minutes to deliver. Immediate PPH noted. Pitocin, methergine, cytotec, TXA, hemabate, and Bakari balloon done by Dr. EuElonda HuskyPatient started on Unasyn for 24 hours due to Bakri placement. Bakri and Foley removed within 12 hours of placement and patient with minimal bleeding thereafter and adequate UOP. Micronor prescribed on discharge. Received Feraheme postpartum and ferrous sulfate prescribed on discharge.  Intrapartum Procedures: Episiotomy: None [1]                                         Lacerations:  None [1]  Patient had a delivery of a Viable infant. 08/13/2019  Information for the patient's newborn:  LeMadge, Therrien0[[id number]]Delivery Method: Vag-Spont     Pateint had an uncomplicated postpartum course.  She is ambulating, tolerating a regular diet, passing flatus, and urinating well. Patient is discharged home in stable condition on  08/15/19.  Delivery time: 8:07 PM    Magnesium Sulfate received: No BMZ received: No Rhophylac:N/A MMR:No Transfusion:No  Physical exam  Vitals:   08/14/19 1928 08/14/19 1947 08/14/19 2334 08/14/19 2335  BP: (!) 100/50 (!) 102/56 109/62   Pulse: 71 70 65   Resp: _0 Temp: 98 F (36.7 C) 98.7 F (37.1 C) 98.1 F (36.7 C)   TempSrc: Oral Oral Oral   SpO2: 98% 98% 96% 95%  Weight:      Height:       General: alert, cooperative and no distress; walking around room Lochia: appropriate Uterine Fundus: firm; U-1 DVT Evaluation: No evidence of DVT seen on physical exam. Negative Homan's sign. Labs: Lab Results  Component Value Date   WBC 16.3 (H) 08/14/2019   HGB 8.8 (L) 08/14/2019   HCT 25.4 (L) 08/14/2019   MCV 92.4 08/14/2019   PLT 159 08/14/2019   No flowsheet data found.  Discharge instruction: per After Visit Summary and "Baby and Me Booklet".  After  visit meds:  Allergies as of 08/15/2019   No Known Allergies     Medication List    STOP taking these medications   cyclobenzaprine 10 MG tablet Commonly known as: FLEXERIL   Magnesium Oxide 200 MG Tabs     TAKE these medications   calcium carbonate 500 MG chewable tablet Commonly known as: TUMS - dosed in mg elemental calcium Chew 2 tablets by mouth as needed for indigestion or heartburn.   Comfort Fit Maternity Supp Med Misc 1 Device by Does not apply route daily.   famotidine 20 MG tablet Commonly known as: Pepcid Take 1 tablet (20 mg total) by mouth at bedtime.   ferrous sulfate 325 (65 FE) MG tablet Take 1 tablet (325 mg total) by mouth daily with breakfast.   norethindrone 0.35 MG tablet Commonly known as: MICRONOR Take 1 tablet (0.35 mg total) by mouth daily.   ondansetron 4 MG disintegrating tablet Commonly known as: Zofran ODT Take 1 tablet (4 mg total) by mouth every 8 (eight) hours as needed for nausea or vomiting.   PRENATAL VITAMINS PO Take 1 tablet by mouth daily.        Diet: routine diet  Activity: Advance as tolerated. Pelvic rest for 6 weeks.   Outpatient follow up:4 weeks Follow up Appt: Future Appointments  Date Time Provider [place]  08/19/2019 10:00 AM Julianne Handler, CNM [facility]  08/22/2019  8:30 AM Emily Filbert, MD [facility]   Follow up Visit: Please schedule this patient for PP visit in: 4 weeks Low risk pregnancy complicated by: PPH requiring bakari Delivery mode:  SVD Anticipated Birth Control:  POPs PP Procedures needed: n/a  Schedule Integrated BH visit: no Provider: Any provider    Newborn Data: Live born female  Birth Weight: 2795 g APGAR (1 MIN): 8   APGAR (5 MINS): 9   APGAR (10 MINS):    Newborn Delivery   Birth date/time: 08/13/2019 20:07:00 Delivery type: Vaginal, Spontaneous      Baby Feeding: Breast Disposition:home with mother

## 2019-08-13 NOTE — Progress Notes (Signed)
08/13/2019 - 6:48 PM  23 y.o. G1P0 [redacted]w[redacted]d   Pregnancy complicated by 2nd trimester PNC, tobacco use disorder.   Patient Active Problem List   Diagnosis Date Noted  . Indication for care in labor or delivery 08/13/2019  . GBS (group B Streptococcus carrier), +RV culture, currently pregnant 08/01/2019  . Supervision of normal first pregnancy, antepartum 03/15/2019  . Tobacco use during pregnancy, antepartum 03/15/2019  . Late prenatal care 03/15/2019    [name] is admitted for SROM   Subjective:  She is doing well, reports since redosing epidural she is doing very well. Feeling intermittent pressure. Objective:   Vitals:   08/13/19 1750 08/13/19 1755 08/13/19 1800 08/13/19 1830  BP: 121/76 117/60 118/78 113/68  Pulse: 88 81 64 71  Resp:      Temp:      TempSrc:      SpO2:      Weight:      Height:        Current Vital Signs 24h Vital Sign Ranges  T 98.7 F (37.1 C) Temp  Avg: 98.6 F (37 C)  Min: 98.4 F (36.9 C)  Max: 98.7 F (37.1 C)  BP 113/68 BP  Min: 90/42  Max: 133/89  HR 71 Pulse  Avg: 69.7  Min: 46  Max: 103  RR 20 Resp  Avg: 18  Min: 14  Max: 20  SaO2 99 %   SpO2  Avg: 98.2 %  Min: 95 %  Max: 100 %       24 Hour I/O Current Shift I/O  Time Ins Outs No intake/output data recorded. No intake/output data recorded.   FHR: 125 baseline, good variability, + accels, no decels.  Toco: q1-3 mins SVE: 9.5/100/+1   Assessment & Plan:  FHT CAT 1 GBS: positive, adequate ppx Pitocin none Analgesia: epidural

## 2019-08-14 LAB — CBC
HCT: 27.6 % — ABNORMAL LOW (ref 36.0–46.0)
HCT: 25.4 % — ABNORMAL LOW (ref 36.0–46.0)
Hemoglobin: 9.8 g/dL — ABNORMAL LOW (ref 12.0–15.0)
Hemoglobin: 8.8 g/dL — ABNORMAL LOW (ref 12.0–15.0)
MCH: 32.3 pg (ref 26.0–34.0)
MCH: 32 pg (ref 26.0–34.0)
MCHC: 35.5 g/dL (ref 30.0–36.0)
MCHC: 34.6 g/dL (ref 30.0–36.0)
MCV: 91.1 fL (ref 80.0–100.0)
MCV: 92.4 fL (ref 80.0–100.0)
Platelets: 179 10*3/uL (ref 150–400)
Platelets: 159 10*3/uL (ref 150–400)
RBC: 3.03 MIL/uL — ABNORMAL LOW (ref 3.87–5.11)
RBC: 2.75 MIL/uL — ABNORMAL LOW (ref 3.87–5.11)
RDW: 12.9 % (ref 11.5–15.5)
RDW: 12.9 % (ref 11.5–15.5)
WBC: 20.4 10*3/uL — ABNORMAL HIGH (ref 4.0–10.5)
WBC: 16.3 10*3/uL — ABNORMAL HIGH (ref 4.0–10.5)
nRBC: 0 % (ref 0.0–0.2)
nRBC: 0 % (ref 0.0–0.2)

## 2019-08-14 MED ORDER — SODIUM CHLORIDE 0.9 % IV SOLN
510.0000 mg | Freq: Once | INTRAVENOUS | Status: AC
  Administered 2019-08-14: 510 mg via INTRAVENOUS
  Filled 2019-08-14: qty 17

## 2019-08-14 NOTE — Anesthesia Postprocedure Evaluation (Signed)
Anesthesia Post Note  Patient: Dispensing optician  Procedure(s) Performed: AN AD HOC LABOR EPIDURAL     Patient location during evaluation: Mother Baby Anesthesia Type: Epidural Level of consciousness: awake and alert and oriented Pain management: satisfactory to patient Vital Signs Assessment: post-procedure vital signs reviewed and stable Respiratory status: spontaneous breathing and nonlabored ventilation Cardiovascular status: stable Postop Assessment: no headache, no backache, no signs of nausea or vomiting, adequate PO intake, patient able to bend at knees and able to ambulate (patient up walking) Anesthetic complications: no    Last Vitals:  Vitals:   08/14/19 1139 08/14/19 1639  BP: (!) 101/55 (!) 101/51  Pulse: 76 73  Resp: 18 17  Temp: 36.7 C 36.9 C  SpO2: 97% 96%    Last Pain:  Vitals:   08/14/19 1639  TempSrc: Oral  PainSc:    Pain Goal: Patients Stated Pain Goal: 2 (08/14/19 0920)                 Willa Rough

## 2019-08-14 NOTE — Lactation Note (Signed)
This note was copied from a baby's chart. Lactation Consultation Note  Patient Name: Kelly Allen [id number] Date: 08/14/2019 Reason for consult: Follow-up assessment  P1 mother whose infant is now 25 hours old.  This is an ETI at 38+6 weeks.  RN requested latch assistance.  Baby was awake and alert when I arrived.  Offered to assist with latch and mother accepted.  Mother's breasts are soft and non tender and nipples are short shafted and intact.  Mother has breast shells and a hand pump at bedside.  She has not started wearing the shells and I suggested she begin.  She has been using the hand pump to help evert nipples prior to latching.  Assisted baby to latch onto the left breast in the cross cradle hold.  She required stimulation to begin sucking and to continue sucking.  Baby had flanged lips and mother denied pain with latching.  Demonstrated breast compressions and how to keep baby awake at the breast during feedings.  She began to suck more actively after a few minutes of gentle stimulation.  Encouraged mother to continue feeding at least every three hours or sooner if baby shows feeding cues.  Mother will call for latch assistance as needed.  She is a [facility] participant in [facility].  No support person present at this time.   Maternal Data    Feeding    LATCH Score                   Interventions    Lactation Tools Discussed/Used     Consult Status Consult Status: Follow-up Date: 08/15/19 Follow-up type: In-patient    [name] 08/14/2019, 3:54 PM

## 2019-08-14 NOTE — Lactation Note (Signed)
This note was copied from a baby's chart. Lactation Consultation Note  Patient Name: Kelly Allen Date: 08/14/2019 Reason for consult: Follow-up assessment  P1 mother whose infant is now 33 hours old.  This is an ETI at38+6 weeks.  Attempted to visit with mother, however, I awakened her when I knocked on the door.  She has no questions at this time and was seen this a.m. by night shift LC.  I allowed her to resume her nap and encouraged her to call [facility] for any questions/concerns she may have.     Maternal Data    Feeding Feeding Type: Breast Fed  LATCH Score Latch: Repeated attempts needed to sustain latch, nipple held in mouth throughout feeding, stimulation needed to elicit sucking reflex.  Audible Swallowing: A few with stimulation  Type of Nipple: Everted at rest and after stimulation  Comfort (Breast/Nipple): Soft / non-tender  Hold (Positioning): Assistance needed to correctly position infant at breast and maintain latch.  LATCH Score: 7  Interventions    Lactation Tools Discussed/Used     Consult Status Consult Status: Follow-up Date: 08/15/19 Follow-up type: In-patient    Karmyn Lowman R Orphia Mctigue 08/14/2019, 1:39 PM

## 2019-08-14 NOTE — Lactation Note (Signed)
This note was copied from a baby's chart. Lactation Consultation Note  Patient Name: Kelly Allen Date: 08/14/2019 Reason for consult: 1st time breastfeeding;Initial assessment;Early term 37-38.6wks P1, 9 hour female infant. Mom 3 rd time breastfeeding infant. LC changed a stool while in room.  Per mom, she feels infant is latching well takes few attempts but infant eventually latches at breast/. Mom has flat nipples LC gave mom hand pump and shells to help evert nipple shaft out more.  Mom shown how to use hand pump  & how to disassemble, clean, & reassemble parts. Mom will wear breast shells in bra during the day to help evert nipple shaft out more. Mom will pre-pump breast prior to latching infant to breast. LC had mom to pre-pump with hand pump, mom latched infant on right breast using cross cradle position, after few attempts infant sustain latch and mom was still breastfeeding when [facility] left room. Mom knows to breastfeed infant according hunger cues, 8 to 12 times within 24 hours and on demand. Mom knows to ask for Nurse or [place] if she has questions, concerns or need assistance with latching infant to breast. Reviewed Baby & Me book's Breastfeeding Basics.  Mom made aware of O/P services, breastfeeding support groups, community resources, and our phone # for post-discharge questions.  Maternal Data Formula Feeding for Exclusion: No Has patient been taught Hand Expression?: Yes Does the patient have breastfeeding experience prior to this delivery?: No  Feeding Feeding Type: Breast Fed  LATCH Score Latch: Repeated attempts needed to sustain latch, nipple held in mouth throughout feeding, stimulation needed to elicit sucking reflex.  Audible Swallowing: Spontaneous and intermittent  Type of Nipple: Flat  Comfort (Breast/Nipple): Soft / non-tender  Hold (Positioning): Assistance needed to correctly position infant at breast and maintain latch.  LATCH Score:  7  Interventions Interventions: Breast feeding basics reviewed;Breast compression;Hand pump;Adjust position;Skin to skin;Support pillows;Breast massage;Position options;Hand express;Expressed milk;Pre-pump if needed;Shells  Lactation Tools Discussed/Used Tools: Shells;Pump Shell Type: (flat nipples) WIC Program: Yes Pump Review: Setup, frequency, and cleaning;Milk Storage Initiated by:: Vicente Serene, IBCLC Date initiated:: 08/14/19   Consult Status Consult Status: Follow-up Date: 08/14/19 Follow-up type: In-patient    Vicente Serene 08/14/2019, 5:13 AM

## 2019-08-14 NOTE — Progress Notes (Signed)
Post Partum Day 1 Subjective: Patient feeling a lot better than last night. Diarrhea resolved. Denies dizziness or lightheadedness. Has not been ambulating due to Foley and Bakri but tolerating PO. Denies other concerns.   Objective: Blood pressure (!) 103/44, pulse 79, temperature 98.2 F (36.8 C), temperature source Oral, resp. rate 17, height 5' (1.524 m), weight 59.9 kg, last menstrual period 10/31/2018, SpO2 96 %.  Physical Exam:  General: alert, cooperative, appears stated age and no distress Lochia: appropriate Uterine Fundus: firm; at umbilicus  DVT Evaluation: No evidence of DVT seen on physical exam.  Recent Labs    08/14/19 0113 08/14/19 0530  HGB 9.8* 8.8*  HCT 27.6* 25.4*    Assessment/Plan: Plan for discharge tomorrow  Will give Feraheme once Order placed to initiate deflating Bakri; remove Foley thereafter Breastfeeding; lactation assistance appreciated Requests POP's on discharge    LOS: 1 day   Kelly Allen 08/14/2019, 9:07 AM

## 2019-08-15 MED ORDER — FERROUS SULFATE 325 (65 FE) MG PO TABS: 325.0000 mg | ORAL_TABLET | Freq: Every day | ORAL | 3 refills | Status: DC

## 2019-08-15 MED ORDER — NORETHINDRONE 0.35 MG PO TABS: 1.0000 | ORAL_TABLET | Freq: Every day | ORAL | 11 refills | Status: DC

## 2019-08-19 ENCOUNTER — Telehealth: Payer: Medicaid Other | Admitting: Certified Nurse Midwife

## 2019-08-20 LAB — BIRTH TISSUE RECOVERY COLLECTION (PLACENTA DONATION)

## 2019-08-22 ENCOUNTER — Encounter: Payer: Medicaid Other | Admitting: Obstetrics & Gynecology

## 2019-09-02 ENCOUNTER — Ambulatory Visit: Payer: Self-pay

## 2019-09-02 NOTE — Lactation Note (Signed)
This note was copied from a baby's chart. Lactation Consultation Note  Patient Name: Kelly Allen [id number] Date: 09/02/2019     09/02/2019  Name: Augusto Garbe MRN: [id number] Date of Birth: 08/13/2017 Gestational Age: Gestational Age: [redacted]w[redacted]d Birth Weight: 98.6 oz Weight today:    7 pounds 3.7 ounces (3280 grams) with clean newborn diaper  31 week old ET infant presents today with mom for feeding assessment.   Mom reports BF is going a lot better and infant is latching better. Infant is BF during the day and is getting some bottles. Infant takes both breasts with each feeding. Mom denies pain with feeding.   Infant self awakening to feed at the breast about 6-7 x a day and will feed about every 2-3 hours at night. Infant is taking 4-5 bottles a day of 3-5 ounces per feeding.   Mom is pumping about 4 x a day and getting 4-5 ounces per pumping. Infant is getting formula 1-2 x a day. Mom reports infant drools some on the Tommie Tippee Level 1 nipple. Discussed using the Tommie Tippee Extra Slow Nipple for feeding. Mom feeds infant using the paced bottle feeding method.   Infant has gained 604 grams in the last 18 days with an average daily weight gain of 34 grams a day.   Infant latched to the breast and fed well. Infant pulls back on mom's breast causing pain. Showed mom how to latch deeper and to keep infant supported during feeding to maintain deeper latch. Nipples rounded post feeding. Infant tolerated feeding well.   Infant currently seeing [facility]ians and was seen last week. She does not have a follow up appointment. Mom is trying to find a pediatrician on [place], Peds list given to her.   Enc mom to call her [facility] office and inquire about a pump since infant needing supplemental bottles. Mom is not sure where her [facility] is. Enc her to call [facility].   General Information: Mother's reason for visit: feeding assessment, ET infant Consult:  Initial Lactation consultant: Nonah Mattes RN,IBCLC Breastfeeding experience: BF is doing much better and infant latching better Maternal medical conditions: Post-partum hemorrhage(EBL 800 ml) Maternal medications: Other(Zofran for nausea, not taking PNV, Enc her to continue PNV while BF)  Breastfeeding History: Frequency of breast feeding: 5-6 x a day and some at night Duration of feeding: 20-30 minutes  Supplementation: Supplement method: bottle(Tommie Tippee, Level 1) Brand: Similac Formula volume: 3-4 ounces Formula frequency: 1-2 x a day   Breast milk volume: 3-5 ounces Breast milk frequency: 2-3 x a day   Pump type: Manual Pump frequency: 4 x a day Pump volume: 4-5 ounces  Infant Output Assessment: Voids per 24 hours: 8+ Urine color: Clear yellow Stools per 24 hours: 8+ Stool color: Yellow  Breast Assessment: Breast: Soft, Compressible Nipple: Erect Pain level: 1 Pain interventions: Bra, Breast pump, Other, Coconut oil  Feeding Assessment: Infant oral assessment: Variance Infant oral assessment comment: Infant with thick labial frenulum that inserts at the bottom of the gum ridge. Upper lip with some tightness to flanging, flanges well on the breast. infant with good tongue mobility. Positioning: Cross cradle(right breast, 15 minutes) Latch: 2 - Grasps breast easily, tongue down, lips flanged, rhythmical sucking. Audible swallowing: 2 - Spontaneous and intermittent Type of nipple: 2 - Everted at rest and after stimulation Comfort: 1 - Filling, red/small blisters or bruises, mild/mod discomfort Hold: 1 - Assistance needed to correctly position infant at breast and maintain latch  LATCH score: 8 Latch assessment: Deep Lips flanged: Yes Suck assessment: Displays both   Pre-feed weight: 3280 grams Post feed weight: 3340 grams Amount transferred: 60 ml Amount supplemented: 0  Additional Feeding Assessment: Infant oral assessment: Variance Infant oral assessment  comment: see above Positioning: Cross cradle(left breast, 15 minutes) Latch: 2 - Grasps breast easily, tongue down, lips flanged, rhythmical sucking. Audible swallowing: 2 - Spontaneous and intermittent Type of nipple: 2 - Everted at rest and after stimulation Comfort: 1 - Filling, red/small blisters or bruises, mild/mod discomfort Hold: 1 - Assistance needed to correctly position infant at breast and maintain latch LATCH score: 8 Latch assessment: Deep Lips flanged: Yes Suck assessment: Displays both   Pre-feed weight: 3306 grams after diaper change Post feed weight: 3314 grams Amount transferred: 8 ml Amount supplemented: 0  Totals: Total amount transferred: 68 ml Total supplement given: 0 Total amount pumped post feed: did not pump   Plan: 1. Offer infant the breast with feeding cues 2. Keep infant awake at the breast with feedings as needed  Feed infant skin to skin 3. Massage/compress the breast with feeding cues as needed to keep infant awake and active at the breast 4. Offer both breasts with each feeding 5. Empty the first breast before offering the second breast 6. Offer infant a bottle of pumped breast milk or formula as needed for feeding 7. Feed infant using the paced bottle feeding method (video on [email or web address]) 8. Infant needs about 60-80 ml (2-3 ounces) for 8 feedings a day. Infant may take more or less depending on how often she feeds. Feed infant until she is satisfied.  9. Continue pumping anytime infant is getting the bottle to protect milk supply. Pump both breast to empty 10. Call [facility] to inquire about vouchers and a breast pump [phone] 11. Keep up the good work 12. Thank you for allowing me to assist you today 13. Please call with any questions or concerns [phone] or [phone] 14. Follow up with Lactation as needed  Ed Blalock RN,  IBCLC                                                     Silas Flood Kayani Rapaport 09/02/2019, 8:20 AM

## 2019-09-22 ENCOUNTER — Ambulatory Visit: Payer: Medicaid Other | Admitting: Obstetrics & Gynecology

## 2019-09-30 ENCOUNTER — Ambulatory Visit: Payer: Medicaid Other | Admitting: Obstetrics and Gynecology

## 2019-10-07 ENCOUNTER — Ambulatory Visit (INDEPENDENT_AMBULATORY_CARE_PROVIDER_SITE_OTHER): Payer: Medicaid Other | Admitting: Certified Nurse Midwife

## 2019-10-07 ENCOUNTER — Other Ambulatory Visit: Payer: Self-pay

## 2019-10-07 ENCOUNTER — Encounter: Payer: Self-pay | Admitting: Certified Nurse Midwife

## 2019-10-07 DIAGNOSIS — Z1389 Encounter for screening for other disorder: Secondary | ICD-10-CM | POA: Diagnosis not present

## 2019-10-07 DIAGNOSIS — Z3202 Encounter for pregnancy test, result negative: Secondary | ICD-10-CM | POA: Diagnosis not present

## 2019-10-07 DIAGNOSIS — Z3042 Encounter for surveillance of injectable contraceptive: Secondary | ICD-10-CM

## 2019-10-07 LAB — POCT URINE PREGNANCY: Preg Test, Ur: NEGATIVE

## 2019-10-07 NOTE — Progress Notes (Signed)
Post Partum Exam  Kelly Allen is a 23 y.o. G1P0 female who presents for a postpartum visit. She is 8 weeks postpartum following a spontaneous vaginal delivery. I have fully reviewed the prenatal and intrapartum course. The delivery was at 17 gestational weeks and 6 days  Anesthesia: epidural. Postpartum course has been unremarkable. Baby's course has been unremarkable. Baby is feeding by breast. Bleeding no bleeding. Bowel function is normal. Bladder function is normal. Patient is sexually active. Contraception method is Depo-Provera injections. Postpartum depression screening:neg  Started Micronor 3 weeks ago, has been sexually active, has missed some pills. Would like to switch to Depo.  Reports having 3 menstrual cycles since starting POP  The following portions of the patient's history were reviewed and updated as appropriate: allergies, current medications, past family history, past medical history, past social history, past surgical history and problem list. Last pap smear done 03/15/19 and was Normal  Review of Systems Pertinent items are noted in HPI.    Objective:  Last menstrual period 10/31/2018.  General:  alert, cooperative and no distress   Breasts:  Not examined  Lungs: Reg rate and effort  Heart:  Reg rate  Abdomen: Not examined   Vulva:  Not examined  Vagina: Not examined  Cervix:  Not examined  Corpus: Not examined  Adnexa:  Not examined  Rectal Exam: Not examined       Neuro: grossly normal  UPT-neg Assessment:  Normal postpartum exam Contraceptive counseling History of PPH  Plan:   1. Contraception: would like to switch to Depo, UPT neg today. Needs UPT again in 2 weeks and if neg then change to Depo. Instructed on continuing POP until then. 2. CBC today 3. Follow up in: 2 weeks or as needed.

## 2019-10-21 ENCOUNTER — Other Ambulatory Visit: Payer: Self-pay

## 2019-10-21 ENCOUNTER — Ambulatory Visit: Payer: Medicaid Other | Admitting: *Deleted

## 2019-10-21 DIAGNOSIS — Z30013 Encounter for initial prescription of injectable contraceptive: Secondary | ICD-10-CM

## 2019-10-21 MED ORDER — MEDROXYPROGESTERONE ACETATE 150 MG/ML IM SUSP: 150.0000 mg | INTRAMUSCULAR | 0 refills | Status: DC

## 2019-10-21 MED ORDER — MEDROXYPROGESTERONE ACETATE 150 MG/ML IM SUSP
150.0000 mg | INTRAMUSCULAR | Status: DC
  Administered 2019-10-26: 150 mg via INTRAMUSCULAR

## 2019-10-21 NOTE — Progress Notes (Signed)
Pt coming today for Depo Provera injection. Rx sent to pharmacy

## 2019-10-26 ENCOUNTER — Ambulatory Visit (INDEPENDENT_AMBULATORY_CARE_PROVIDER_SITE_OTHER): Payer: Medicaid Other | Admitting: *Deleted

## 2019-10-26 ENCOUNTER — Other Ambulatory Visit: Payer: Self-pay

## 2019-10-26 DIAGNOSIS — Z3202 Encounter for pregnancy test, result negative: Secondary | ICD-10-CM

## 2019-10-26 DIAGNOSIS — Z3042 Encounter for surveillance of injectable contraceptive: Secondary | ICD-10-CM | POA: Diagnosis not present

## 2019-10-26 LAB — POCT URINE PREGNANCY: Preg Test, Ur: NEGATIVE

## 2019-10-26 NOTE — Progress Notes (Signed)
UPT neg and Depo Provera 150 mg given Rt deltoid IM

## 2019-12-16 ENCOUNTER — Encounter (HOSPITAL_COMMUNITY): Payer: Self-pay | Admitting: Obstetrics and Gynecology

## 2020-01-12 ENCOUNTER — Other Ambulatory Visit: Payer: Self-pay | Admitting: *Deleted

## 2020-01-12 DIAGNOSIS — Z30013 Encounter for initial prescription of injectable contraceptive: Secondary | ICD-10-CM

## 2020-01-12 MED ORDER — MEDROXYPROGESTERONE ACETATE 150 MG/ML IM SUSP: 150.0000 mg | INTRAMUSCULAR | 0 refills | Status: DC

## 2020-01-13 ENCOUNTER — Ambulatory Visit: Payer: Medicaid Other | Admitting: *Deleted

## 2020-01-16 ENCOUNTER — Ambulatory Visit: Payer: Medicaid Other | Admitting: *Deleted

## 2020-01-16 ENCOUNTER — Other Ambulatory Visit: Payer: Self-pay

## 2020-01-16 DIAGNOSIS — Z3042 Encounter for surveillance of injectable contraceptive: Secondary | ICD-10-CM | POA: Diagnosis not present

## 2020-01-16 NOTE — Progress Notes (Signed)
Pt here for Depo Provera 150 mg IM only

## 2020-04-04 ENCOUNTER — Other Ambulatory Visit: Payer: Self-pay

## 2020-04-04 ENCOUNTER — Ambulatory Visit (INDEPENDENT_AMBULATORY_CARE_PROVIDER_SITE_OTHER): Payer: Medicaid Other

## 2020-04-04 ENCOUNTER — Telehealth: Payer: Self-pay

## 2020-04-04 DIAGNOSIS — Z3042 Encounter for surveillance of injectable contraceptive: Secondary | ICD-10-CM | POA: Diagnosis not present

## 2020-04-04 DIAGNOSIS — Z30013 Encounter for initial prescription of injectable contraceptive: Secondary | ICD-10-CM

## 2020-04-04 MED ORDER — MEDROXYPROGESTERONE ACETATE 150 MG/ML IM SUSP: 150.0000 mg | INTRAMUSCULAR | 0 refills | Status: DC

## 2020-04-04 MED ORDER — MEDROXYPROGESTERONE ACETATE 150 MG/ML IM SUSP
150.0000 mg | Freq: Once | INTRAMUSCULAR | Status: AC
  Administered 2020-04-04: 150 mg via INTRAMUSCULAR

## 2020-04-04 NOTE — Telephone Encounter (Signed)
Pt called needing refill of Depo Provera before appt today. Refill sent.

## 2020-04-04 NOTE — Progress Notes (Signed)
Pt here for Depo Provera injection. Injection given in right deltoid and tolerated well. Pt will return 8/4 for next injection

## 2020-05-30 IMAGING — US US MFM OB COMP + 14 WK
1 series · 14 of 28 positions shown · non-contrast
Comparison: none

[Series 1: us mfm ob comp + 14 wk · 143 acquisitions, 14 frames shown]
[im 6/143]
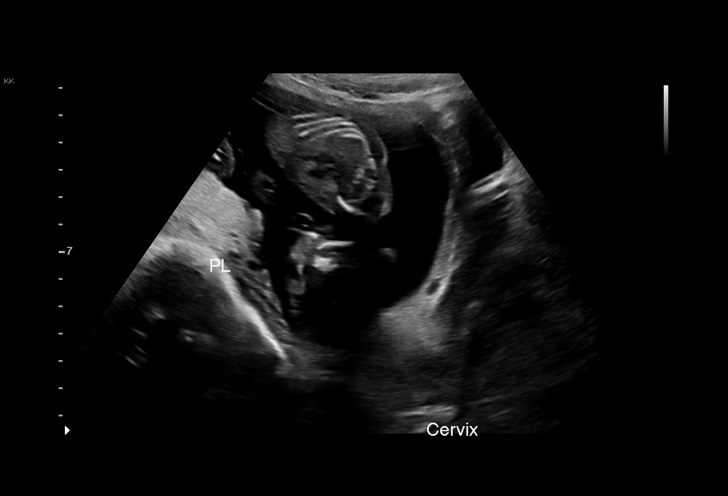
[im 16/143]
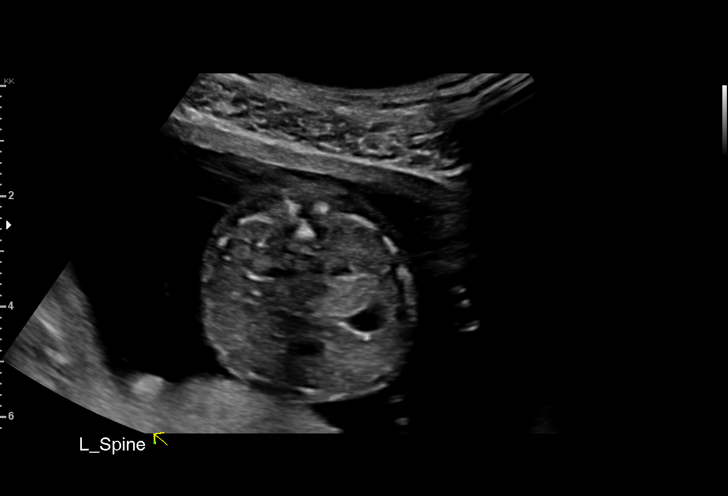
[im 27/143]
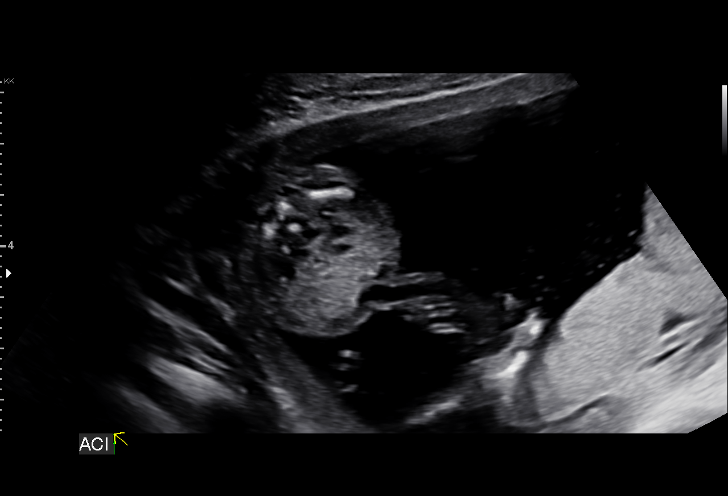
[im 37/143]
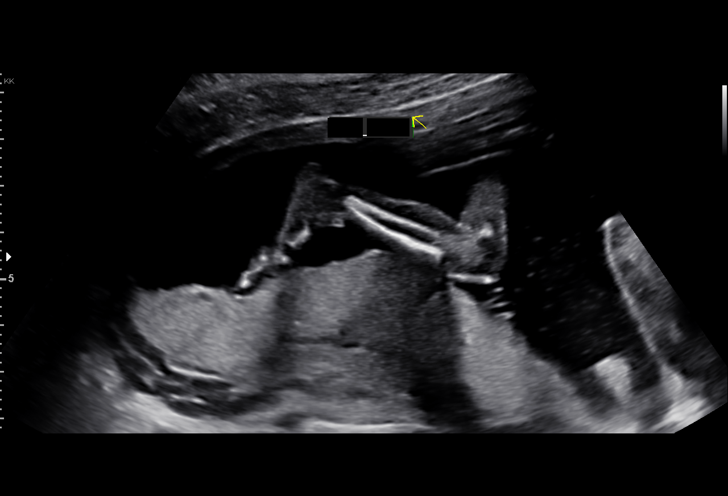
[im 48/143]
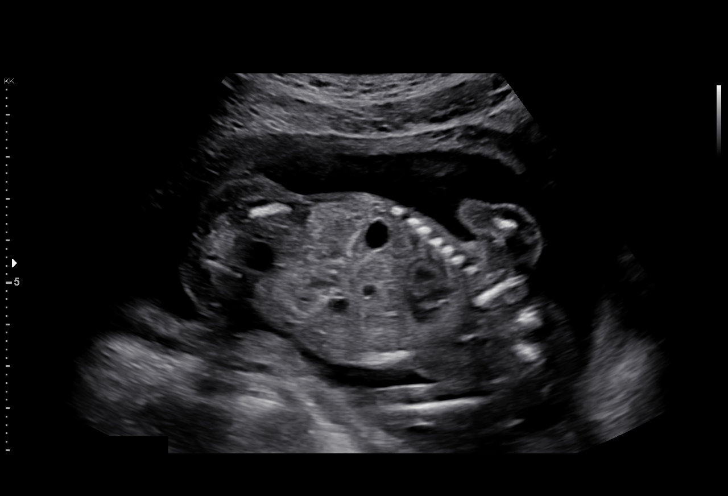
[im 58/143]
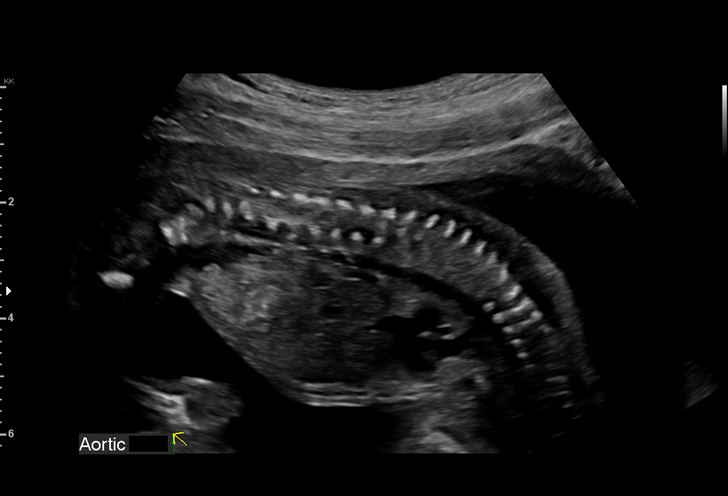
[im 69/143]
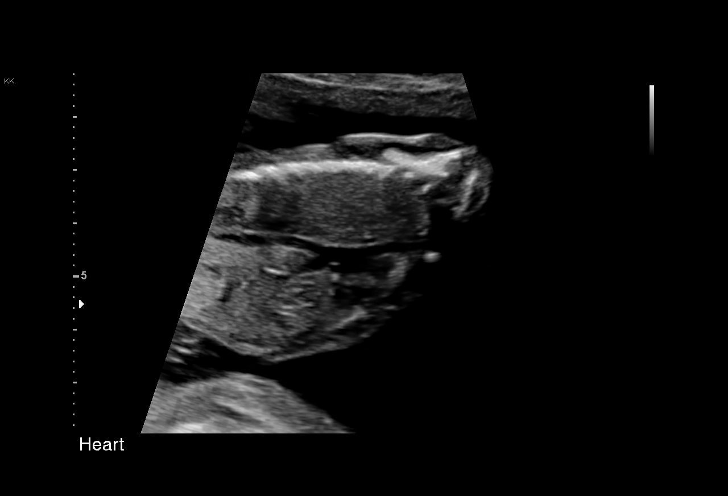
[im 79/143]
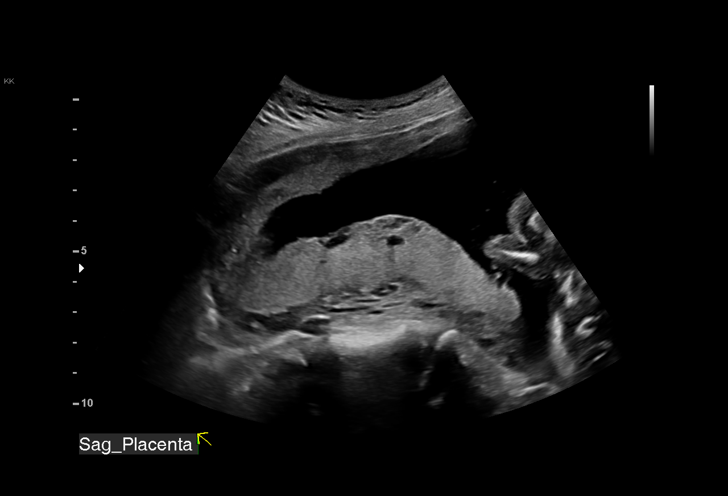
[im 90/143]
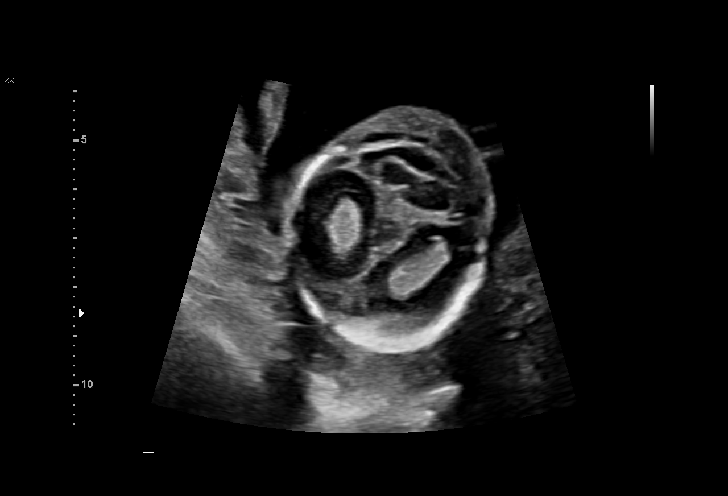
[im 100/143]
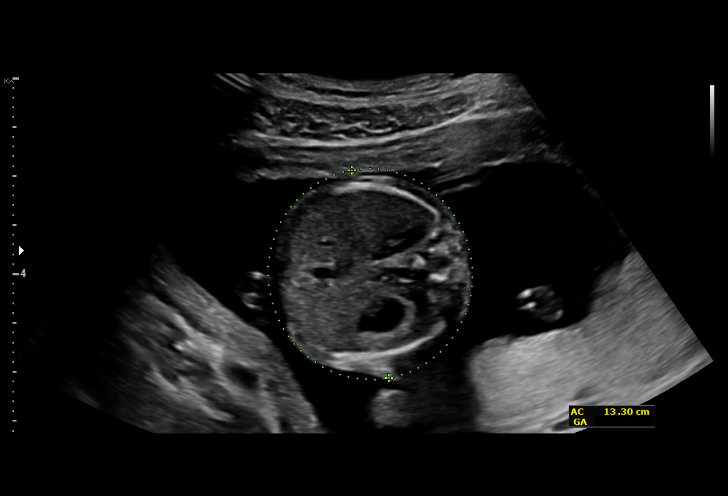
[im 111/143]
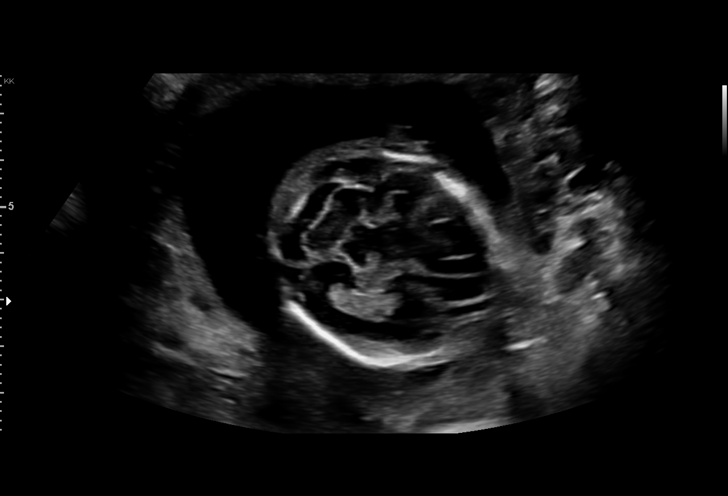
[im 121/143]
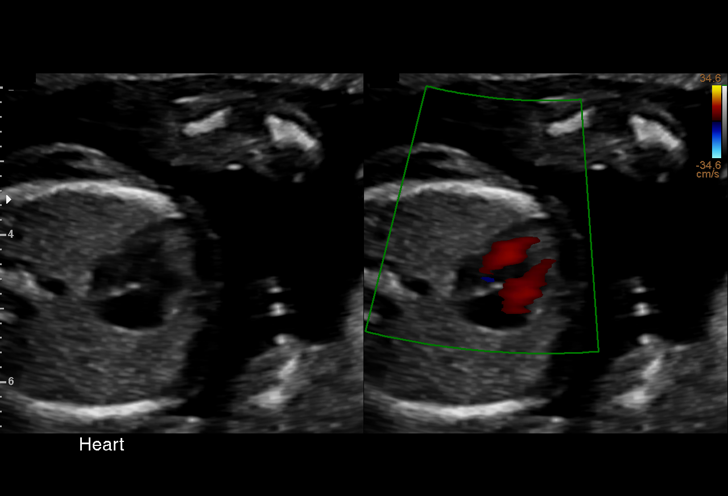
[im 132/143]
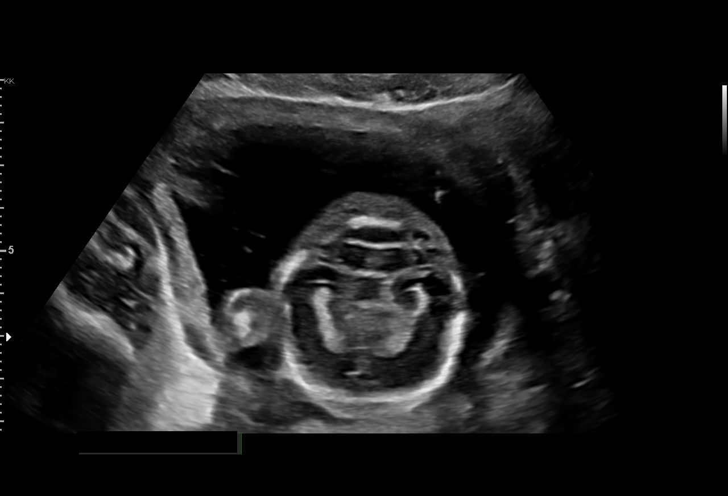
[im 143/143]
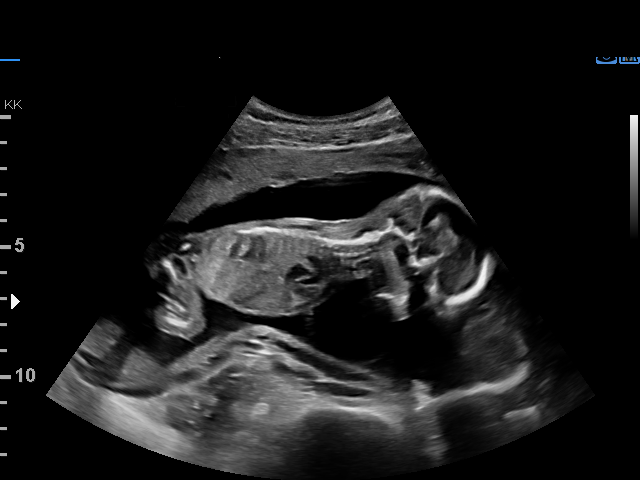

[14 of 28 positions shown; findings below may reference images not displayed]

YING CRACKER CNM

  1  US MFM OB COMP + 14 WK               76805.01     NAILAH JUMPER
 ----------------------------------------------------------------------

 ----------------------------------------------------------------------
Indications

  18 weeks gestation of pregnancy
  Encounter for antenatal screening for
  malformations
  Tobacco use complicating pregnancy,
  second trimester( Low Risk NIPS)
  Late prenatal care, second trimester
  Encounter for uncertain dates
 ----------------------------------------------------------------------
Fetal Evaluation

 Num Of Fetuses:         1
 Fetal Heart Rate(bpm):  149
 Cardiac Activity:       Observed
 Presentation:           Cephalic
 Placenta:               Posterior
 P. Cord Insertion:      Visualized

 Amniotic Fluid
 AFI FV:      Within normal limits

                             Largest Pocket(cm)

Biometry

 BPD:      42.3  mm     G. Age:  18w 6d         68  %    CI:        78.23   %    70 - 86
                                                         FL/HC:      17.5   %    15.8 - 18
 HC:      151.3  mm     G. Age:  18w 1d         30  %    HC/AC:      1.13        1.07 -
 AC:      134.1  mm     G. Age:  18w 6d         62  %    FL/BPD:     62.6   %
 FL:       26.5  mm     G. Age:  18w 0d         31  %    FL/AC:      19.8   %    20 - 24
 HUM:      27.3  mm     G. Age:  18w 5d         61  %
 Est. FW:     242  gm      0 lb 9 oz     48  %
OB History

 Gravidity:    1
Gestational Age

 LMP:           20w 3d        Date:  10/31/18                 EDD:   08/07/19
 U/S Today:     18w 3d                                        EDD:   08/21/19
 Best:          18w 3d     Det. By:  U/S (03/23/19)           EDD:   08/21/19
Anatomy

 Cranium:               Appears normal         Aortic Arch:            Appears normal
 Cavum:                 Appears normal         Ductal Arch:            Appears normal
 Ventricles:            Appears normal         Diaphragm:              Appears normal
 Choroid Plexus:        Appears normal         Stomach:                Appears normal, left
                                                                       sided
 Cerebellum:            Appears normal         Abdomen:                Appears normal
 Posterior Fossa:       Appears normal         Abdominal Wall:         Appears nml (cord
                                                                       insert, abd wall)
 Nuchal Fold:           Appears normal         Cord Vessels:           Appears normal (3
                                                                       vessel cord)
 Face:                  Orbits appear          Kidneys:                Appear normal
                        normal
 Lips:                  Not well visualized    Bladder:                Appears normal
 Thoracic:              Appears normal         Spine:                  Appears normal
 Heart:                 Appears normal         Upper Extremities:      Appears normal
                        (4CH, axis, and
                        situs)
 RVOT:                  Appears normal         Lower Extremities:      Appears normal
 LVOT:                  Appears normal

 Other:  Female gender Heels and 5th digit visualized. Technically difficult due
         to fetal position.
Cervix Uterus Adnexa

 Cervix
 Length:            3.1  cm.
 Normal appearance by transabdominal scan.
Impression

 Normal interval growth.  No ultrasonic evidence of structural
 fetal anomalies.
 Suboptimal views of the fetal anatomy were obtained
 secondary to fetal position.
 Dating assigned by today's examination.
 Low risk cell free DNA.
Recommendations

 Follow up anatomy scheduled in 6 weeks.

## 2020-07-02 ENCOUNTER — Other Ambulatory Visit: Payer: Self-pay | Admitting: *Deleted

## 2020-07-02 ENCOUNTER — Ambulatory Visit: Payer: Medicaid Other

## 2020-07-02 DIAGNOSIS — Z30013 Encounter for initial prescription of injectable contraceptive: Secondary | ICD-10-CM

## 2020-07-02 MED ORDER — MEDROXYPROGESTERONE ACETATE 150 MG/ML IM SUSP: 150.0000 mg | INTRAMUSCULAR | 0 refills | Status: DC

## 2020-07-04 ENCOUNTER — Ambulatory Visit (INDEPENDENT_AMBULATORY_CARE_PROVIDER_SITE_OTHER): Payer: Medicaid Other | Admitting: *Deleted

## 2020-07-04 ENCOUNTER — Ambulatory Visit: Payer: Medicaid Other

## 2020-07-04 ENCOUNTER — Other Ambulatory Visit: Payer: Self-pay

## 2020-07-04 DIAGNOSIS — Z30013 Encounter for initial prescription of injectable contraceptive: Secondary | ICD-10-CM

## 2020-07-04 DIAGNOSIS — Z3042 Encounter for surveillance of injectable contraceptive: Secondary | ICD-10-CM

## 2020-07-04 MED ORDER — MEDROXYPROGESTERONE ACETATE 150 MG/ML IM SUSP
150.0000 mg | Freq: Once | INTRAMUSCULAR | Status: AC
  Administered 2020-07-04: 150 mg via INTRAMUSCULAR

## 2020-07-04 NOTE — Progress Notes (Signed)
Pt here for Depo Provera injection only.  She did provide her med.

## 2020-09-19 ENCOUNTER — Other Ambulatory Visit: Payer: Self-pay | Admitting: Obstetrics & Gynecology

## 2020-09-19 ENCOUNTER — Telehealth: Payer: Self-pay

## 2020-09-19 ENCOUNTER — Other Ambulatory Visit: Payer: Self-pay

## 2020-09-19 ENCOUNTER — Ambulatory Visit (INDEPENDENT_AMBULATORY_CARE_PROVIDER_SITE_OTHER): Payer: Medicaid Other | Admitting: *Deleted

## 2020-09-19 DIAGNOSIS — Z789 Other specified health status: Secondary | ICD-10-CM

## 2020-09-19 DIAGNOSIS — Z3042 Encounter for surveillance of injectable contraceptive: Secondary | ICD-10-CM | POA: Diagnosis not present

## 2020-09-19 DIAGNOSIS — Z30013 Encounter for initial prescription of injectable contraceptive: Secondary | ICD-10-CM

## 2020-09-19 MED ORDER — MEDROXYPROGESTERONE ACETATE 150 MG/ML IM SUSP: 150.0000 mg | INTRAMUSCULAR | 0 refills | Status: DC

## 2020-09-19 MED ORDER — MEDROXYPROGESTERONE ACETATE 150 MG/ML IM SUSP
150.0000 mg | Freq: Once | INTRAMUSCULAR | Status: AC
  Administered 2020-09-19: 150 mg via INTRAMUSCULAR

## 2020-09-19 NOTE — Telephone Encounter (Signed)
Pt has appt for depo provera injection today. Pt states pharmacy doesn't have any refills. Refill sent.

## 2020-09-19 NOTE — Progress Notes (Signed)
Pt here for Depo Provera only.  She will be due for her annual before next injection.  Appt made for annual 10/09/20

## 2020-10-09 ENCOUNTER — Ambulatory Visit: Payer: Medicaid Other | Admitting: Advanced Practice Midwife

## 2020-10-16 ENCOUNTER — Ambulatory Visit: Payer: Medicaid Other | Admitting: Certified Nurse Midwife

## 2020-10-16 ENCOUNTER — Other Ambulatory Visit: Payer: Self-pay

## 2020-10-16 ENCOUNTER — Encounter: Payer: Self-pay | Admitting: Certified Nurse Midwife

## 2020-10-16 VITALS — BP 118/79 | HR 73 | Resp 16 | Ht 60.0 in | Wt 133.0 lb

## 2020-10-16 DIAGNOSIS — Z01419 Encounter for gynecological examination (general) (routine) without abnormal findings: Secondary | ICD-10-CM

## 2020-10-16 DIAGNOSIS — Z30013 Encounter for initial prescription of injectable contraceptive: Secondary | ICD-10-CM | POA: Diagnosis not present

## 2020-10-16 MED ORDER — MEDROXYPROGESTERONE ACETATE 150 MG/ML IM SUSP: 150.0000 mg | INTRAMUSCULAR | 3 refills | Status: DC

## 2020-10-16 NOTE — Progress Notes (Signed)
GYNECOLOGY ANNUAL PREVENTATIVE CARE ENCOUNTER NOTE  History:     Kelly Allen is a 24 y.o. G1P1 female here for a routine annual gynecologic exam.  Current complaints: none.   Denies abnormal vaginal bleeding, discharge, pelvic pain, problems with intercourse or other gynecologic concerns.    Gynecologic History No LMP recorded. Patient has had an injection. Contraception: Depo-Provera injections Last Pap: 03/2019. Results were: normal with negative HPV  Obstetric History OB History  Gravida Para Term Preterm AB Living  1 1       1   SAB TAB Ectopic Multiple Live Births          1    # Outcome Date GA Lbr Len/2nd Weight Sex Delivery Anes PTL Lv  1 Para             Past Medical History:  Diagnosis Date  . Acid reflux   . IBS (irritable bowel syndrome)     Past Surgical History:  Procedure Laterality Date  . CHOLECYSTECTOMY    . WISDOM TOOTH EXTRACTION      No current outpatient medications on file prior to visit.   No current facility-administered medications on file prior to visit.    No Known Allergies  Social History:  reports that she has been smoking cigarettes. She has a 1.75 pack-year smoking history. She has never used smokeless tobacco. She reports previous alcohol use. She reports previous drug use.  Family History  Problem Relation Age of Onset  . Diabetes Mother   . Asthma Father   . COPD Father   . Cancer Paternal Grandmother        stomach    The following portions of the patient's history were reviewed and updated as appropriate: allergies, current medications, past family history, past medical history, past social history, past surgical history and problem list.  Review of Systems Pertinent items noted in HPI and remainder of comprehensive ROS otherwise negative.  Physical Exam:  BP 118/79   Pulse 73   Resp 16   Ht 5' (1.524 m)   Wt 133 lb (60.3 kg)   BMI 25.97 kg/m  CONSTITUTIONAL: Well-developed, well-nourished female in no acute  distress.  HENT:  Normocephalic, atraumatic, External right and left ear normal. Oropharynx is clear and moist EYES: Conjunctivae and EOM are normal. Pupils are equal, round, and reactive to light.  NECK: Normal range of motion, supple, no masses.  Normal thyroid.  SKIN: Skin is warm and dry. No rash noted. Not diaphoretic. No erythema. No pallor. MUSCULOSKELETAL: Normal range of motion. No tenderness.  No cyanosis, clubbing, or edema.  2+ distal pulses. NEUROLOGIC: Alert and oriented to person, place, and time. Normal reflexes, muscle tone coordination.  PSYCHIATRIC: Normal mood and affect. Normal behavior. Normal judgment and thought content. CARDIOVASCULAR: Normal heart rate noted, regular rhythm RESPIRATORY: Clear to auscultation bilaterally. Effort and breath sounds normal, no problems with respiration noted. BREASTS: Symmetric in size. No masses, tenderness, skin changes, nipple drainage, or lymphadenopathy bilaterally. ABDOMEN: Soft, no distention noted.  No tenderness, rebound or guarding.    Assessment and Plan:    1. Women's annual routine gynecological examination - normal well woman examination  - patient reports that she is considering pregnancy next year, educated and discussed pregnancy spacing of 18 months which would be March 2020  - Patient plans to continue with depo until she decides about pregnancy  - pap up to date   2. Encounter for prescription for depo-Provera - Rx for depo sent  to pharmacy of choice  - medroxyPROGESTERone (DEPO-PROVERA) 150 MG/ML injection; Inject 1 mL (150 mg total) into the muscle every 3 (three) months.  Dispense: 1 mL; Refill: 3   Routine preventative health maintenance measures emphasized. Please refer to After Visit Summary for other counseling recommendations.      Sharyon Cable, CNM Center for [employer], [facility]

## 2020-12-05 ENCOUNTER — Ambulatory Visit: Payer: Medicaid Other

## 2021-02-27 ENCOUNTER — Ambulatory Visit: Payer: Medicaid Other

## 2021-02-27 ENCOUNTER — Telehealth: Payer: Self-pay | Admitting: *Deleted

## 2021-02-27 NOTE — Telephone Encounter (Signed)
Not able to leave patient a message due to phone having a busy signal.

## 2021-05-22 ENCOUNTER — Ambulatory Visit: Payer: Medicaid Other

## 2021-08-14 ENCOUNTER — Ambulatory Visit: Payer: Medicaid Other

## 2021-08-22 ENCOUNTER — Telehealth: Payer: Self-pay | Admitting: *Deleted

## 2021-08-22 NOTE — Telephone Encounter (Signed)
New OB scheduled for 09/10/2021. Patient states that she is having increased pain, bleeding, and discharge. Patient advised to be evaluated in MAU, patient agreed.

## 2021-08-23 ENCOUNTER — Other Ambulatory Visit: Payer: Self-pay

## 2021-08-23 ENCOUNTER — Encounter (HOSPITAL_COMMUNITY): Payer: Self-pay | Admitting: Obstetrics and Gynecology

## 2021-08-23 ENCOUNTER — Inpatient Hospital Stay (HOSPITAL_COMMUNITY): Payer: Medicaid Other

## 2021-08-23 ENCOUNTER — Inpatient Hospital Stay (HOSPITAL_COMMUNITY)
Admission: AD | Admit: 2021-08-23 | Discharge: 2021-08-23 | Disposition: A | Discharge status: Home / Self Care | Payer: Medicaid Other | Attending: Obstetrics and Gynecology | Admitting: Obstetrics and Gynecology

## 2021-08-23 DIAGNOSIS — O208 Other hemorrhage in early pregnancy: Secondary | ICD-10-CM | POA: Insufficient documentation

## 2021-08-23 DIAGNOSIS — R109 Unspecified abdominal pain: Secondary | ICD-10-CM

## 2021-08-23 DIAGNOSIS — O418X10 Other specified disorders of amniotic fluid and membranes, first trimester, not applicable or unspecified: Secondary | ICD-10-CM

## 2021-08-23 DIAGNOSIS — R110 Nausea: Secondary | ICD-10-CM | POA: Insufficient documentation

## 2021-08-23 DIAGNOSIS — Z3A01 Less than 8 weeks gestation of pregnancy: Secondary | ICD-10-CM | POA: Diagnosis not present

## 2021-08-23 DIAGNOSIS — O209 Hemorrhage in early pregnancy, unspecified: Secondary | ICD-10-CM

## 2021-08-23 DIAGNOSIS — R1030 Lower abdominal pain, unspecified: Secondary | ICD-10-CM | POA: Insufficient documentation

## 2021-08-23 DIAGNOSIS — O99331 Smoking (tobacco) complicating pregnancy, first trimester: Secondary | ICD-10-CM | POA: Diagnosis not present

## 2021-08-23 DIAGNOSIS — O26899 Other specified pregnancy related conditions, unspecified trimester: Secondary | ICD-10-CM

## 2021-08-23 DIAGNOSIS — O99611 Diseases of the digestive system complicating pregnancy, first trimester: Secondary | ICD-10-CM | POA: Diagnosis not present

## 2021-08-23 DIAGNOSIS — F17210 Nicotine dependence, cigarettes, uncomplicated: Secondary | ICD-10-CM | POA: Insufficient documentation

## 2021-08-23 DIAGNOSIS — O26891 Other specified pregnancy related conditions, first trimester: Secondary | ICD-10-CM | POA: Diagnosis not present

## 2021-08-23 DIAGNOSIS — O468X1 Other antepartum hemorrhage, first trimester: Secondary | ICD-10-CM

## 2021-08-23 LAB — CBC
HCT: 40.6 % (ref 36.0–46.0)
Hemoglobin: 14.3 g/dL (ref 12.0–15.0)
MCH: 31.1 pg (ref 26.0–34.0)
MCHC: 35.2 g/dL (ref 30.0–36.0)
MCV: 88.3 fL (ref 80.0–100.0)
Platelets: 335 10*3/uL (ref 150–400)
RBC: 4.6 MIL/uL (ref 3.87–5.11)
RDW: 12.4 % (ref 11.5–15.5)
WBC: 7.8 10*3/uL (ref 4.0–10.5)
nRBC: 0 % (ref 0.0–0.2)

## 2021-08-23 LAB — POCT PREGNANCY, URINE: Preg Test, Ur: POSITIVE — AB

## 2021-08-23 LAB — URINALYSIS, ROUTINE W REFLEX MICROSCOPIC
Bacteria, UA: NONE SEEN
Bilirubin Urine: NEGATIVE
Glucose, UA: NEGATIVE mg/dL
Ketones, ur: NEGATIVE mg/dL
Nitrite: NEGATIVE
Protein, ur: NEGATIVE mg/dL
Specific Gravity, Urine: 1.01 (ref 1.005–1.030)
pH: 6 (ref 5.0–8.0)

## 2021-08-23 LAB — WET PREP, GENITAL
Clue Cells Wet Prep HPF POC: NONE SEEN
Sperm: NONE SEEN
Trich, Wet Prep: NONE SEEN
Yeast Wet Prep HPF POC: NONE SEEN

## 2021-08-23 MED ORDER — PROMETHAZINE HCL 25 MG PO TABS
25.0000 mg | ORAL_TABLET | Freq: Once | ORAL | Status: AC
  Administered 2021-08-23: 25 mg via ORAL
  Filled 2021-08-23: qty 1

## 2021-08-23 MED ORDER — PROMETHAZINE HCL 25 MG PO TABS: 25.0000 mg | ORAL_TABLET | Freq: Four times a day (QID) | ORAL | 0 refills | Status: DC | PRN

## 2021-08-23 NOTE — MAU Note (Signed)
Pt reports increased pinkish discharge for 2 days or so. Also has severe sharp, pain in her lower abdomen. Also reports nausea and vomiting. Denies in bright red blood.

## 2021-08-23 NOTE — MAU Provider Note (Signed)
Chief Complaint:  Vaginal Discharge, Vaginal Bleeding, and Abdominal Pain  None    HPI: Kelly Allen is a 25 y.o. G2P1001 at [redacted]w[redacted]d who presents to maternity admissions reporting lower abdominal cramping, spotting, and nausea. Patient reports lower abdominal cramping has been ongoing however started having spotting on Monday. She denies itching/odor/irritation, urinary s/s, or recent intercourse. Was evaluated at [facility] ED for similar symptoms recently, however was told she was too early and to follow up with her OBGYN. She also reports ongoing nausea, taking Zofran, however it gives her a headache so she is not taking like she should.   Pregnancy Course:   Past Medical History:  Diagnosis Date   Acid reflux    IBS (irritable bowel syndrome)    OB History  Gravida Para Term Preterm AB Living  2 1 1     1   SAB IAB Ectopic Multiple Live Births          1    # Outcome Date GA Lbr Len/2nd Weight Sex Delivery Anes PTL Lv  2 Current           1 Term 08/13/19 [redacted]w[redacted]d   F Vag-Spont   LIV   Past Surgical History:  Procedure Laterality Date   CHOLECYSTECTOMY     WISDOM TOOTH EXTRACTION     Family History  Problem Relation Age of Onset   Diabetes Mother    Asthma Father    COPD Father    Cancer Paternal Grandmother        stomach   Social History   Tobacco Use   Smoking status: Every Day    Packs/day: 0.25    Years: 7.00    Pack years: 1.75    Types: Cigarettes   Smokeless tobacco: Never  Vaping Use   Vaping Use: Former   Substances: Nicotine  Substance Use Topics   Alcohol use: Not Currently   Drug use: Not Currently   No Known Allergies Medications Prior to Admission  Medication Sig Dispense Refill Last Dose   ondansetron (ZOFRAN) 4 MG tablet Take 4 mg by mouth every 8 (eight) hours as needed for nausea or vomiting.   08/23/2021   medroxyPROGESTERone (DEPO-PROVERA) 150 MG/ML injection Inject 1 mL (150 mg total) into the muscle every 3 (three) months. 1 mL 3     I have  reviewed patient's Past Medical Hx, Surgical Hx, Family Hx, Social Hx, medications and allergies.   ROS:  Review of Systems  Constitutional: Negative.   Respiratory: Negative.    Cardiovascular: Negative.   Gastrointestinal:  Positive for abdominal pain and nausea. Negative for constipation, diarrhea and vomiting.  Genitourinary:  Positive for vaginal bleeding (spotting) and vaginal discharge. Negative for dysuria and frequency.  Musculoskeletal: Negative.   Neurological: Negative.    Physical Exam  Patient Vitals for the past 24 hrs:  BP Temp Temp src Pulse Resp SpO2 Height Weight  08/23/21 1313 103/78 98.6 F (37 C) Oral 79 16 98 % 5' (1.524 m) 52 kg   Constitutional: well-developed, well-nourished female in no acute distress.  Cardiovascular: normal rate Respiratory: normal effort GI: abd soft, non-tender MS: extremities nontender, no edema, normal ROM Neurologic: alert and oriented x 4.  GU: neg CVAT. Pelvic: NEFG, physiologic discharge, no blood, cervix visually closed, clean without lesions/masses, no CMT      Labs: Results for orders placed or performed during the hospital encounter of 08/23/21 (from the past 24 hour(s))  Pregnancy, urine POC     Status: Abnormal  Collection Time: 08/23/21  1:08 PM  Result Value Ref Range   Preg Test, Ur POSITIVE (A) NEGATIVE  Urinalysis, Routine w reflex microscopic Urine, Clean Catch     Status: Abnormal   Collection Time: 08/23/21  1:09 PM  Result Value Ref Range   Color, Urine YELLOW YELLOW   APPearance CLOUDY (A) CLEAR   Specific Gravity, Urine 1.010 1.005 - 1.030   pH 6.0 5.0 - 8.0   Glucose, UA NEGATIVE NEGATIVE mg/dL   Hgb urine dipstick SMALL (A) NEGATIVE   Bilirubin Urine NEGATIVE NEGATIVE   Ketones, ur NEGATIVE NEGATIVE mg/dL   Protein, ur NEGATIVE NEGATIVE mg/dL   Nitrite NEGATIVE NEGATIVE   Leukocytes,Ua LARGE (A) NEGATIVE   RBC / HPF 0-5 0 - 5 RBC/hpf   WBC, UA 21-50 0 - 5 WBC/hpf   Bacteria, UA NONE SEEN NONE  SEEN   Squamous Epithelial / LPF 21-50 0 - 5   Mucus PRESENT   CBC     Status: None   Collection Time: 08/23/21  1:26 PM  Result Value Ref Range   WBC 7.8 4.0 - 10.5 K/uL   RBC 4.60 3.87 - 5.11 MIL/uL   Hemoglobin 14.3 12.0 - 15.0 g/dL   HCT 01.6 01.0 - 93.2 %   MCV 88.3 80.0 - 100.0 fL   MCH 31.1 26.0 - 34.0 pg   MCHC 35.2 30.0 - 36.0 g/dL   RDW 35.5 73.2 - 20.2 %   Platelets 335 150 - 400 K/uL   nRBC 0.0 0.0 - 0.2 %  Wet prep, genital     Status: Abnormal   Collection Time: 08/23/21  1:47 PM   Specimen: Cervix  Result Value Ref Range   Yeast Wet Prep HPF POC NONE SEEN NONE SEEN   Trich, Wet Prep NONE SEEN NONE SEEN   Clue Cells Wet Prep HPF POC NONE SEEN NONE SEEN   WBC, Wet Prep HPF POC MANY (A) NONE SEEN   Sperm NONE SEEN     Imaging:  US OB LESS THAN 14 WEEKS WITH OB TRANSVAGINAL  Result Date: 08/23/2021 CLINICAL DATA:  Abdominal pain. Last menstrual period 07/06/2021. Gestational age by last menstrual period 6 weeks and 6 days. Estimated due date by last menstrual period 04/12/2022 EXAM: OBSTETRIC <14 WK [place] AND TRANSVAGINAL OB US TECHNIQUE: Both transabdominal and transvaginal ultrasound examinations were performed for complete evaluation of the gestation as well as the maternal uterus, adnexal regions, and pelvic cul-de-sac. Transvaginal technique was performed to assess early pregnancy. COMPARISON:  None. FINDINGS: Intrauterine gestational sac: Single Yolk sac:  Visualized. Embryo:  Visualized. Cardiac Activity: Visualized. Heart Rate: 147 bpm CRL:  11.3 mm   7 w   1 d                  [place] EDC: 04/10/2022 Subchorionic hemorrhage:  Moderate to large volume. Maternal uterus/adnexae: Bilateral ovaries are unremarkable. Pelvic free fluid: Trace free fluid. IMPRESSION: 1. Single live intrauterine pregnancy with a gestational age by ultrasound of 7 weeks and 1 day which is concordant with gestational age by last menstrual period of 6 weeks and 6 days. 2. Moderate to large volume  subchorionic hemorrhage. 3. Trace free pelvic fluid which may physiologic in etiology. Electronically Signed   By: Tish Frederickson M.D.   On: 08/23/2021 15:04    MAU Course: Orders Placed This Encounter  Procedures   Wet prep, genital   US OB LESS THAN 14 WEEKS WITH OB TRANSVAGINAL   Urinalysis, Routine  w reflex microscopic   CBC   hCG, quantitative, pregnancy   Pregnancy, urine POC   Discharge patient   Meds ordered this encounter  Medications   promethazine (PHENERGAN) tablet 25 mg   promethazine (PHENERGAN) 25 MG tablet    Sig: Take 1 tablet (25 mg total) by mouth every 6 (six) hours as needed for nausea or vomiting.    Dispense:  30 tablet    Refill:  0    Order Specific Question:   Supervising Provider    Answer:   Reva Bores [[id number]]    MDM: UA CBC, HCG Wet prep, GC/CT Ultrasound with IUP. Moderate subchorionic hemorrhage  Phenergan PO with improvement in nausea  Assessment: 1. [redacted] weeks gestation of pregnancy   2. Abdominal pain affecting pregnancy   3. Subchorionic hematoma in first trimester, single or unspecified fetus   4. Vaginal bleeding affecting early pregnancy     Plan: Discharge home in stable condition  Phenergan Rx sent to pharmacy Recommend pelvic rest. Bleeding precautions reviewed Return to MAU as needed Keep appointment at [facility] as scheduled on 09/10/21    Follow-up Information     Center for [facility] [place] Follow up.   Specialty: Obstetrics and Gynecology Why: as scheduled. Return to MAU as needed. Contact information: [address] [place] [address] [phone]                Allergies as of 08/23/2021   No Known Allergies      Medication List     STOP taking these medications    medroxyPROGESTERone 150 MG/ML injection Commonly known as: DEPO-PROVERA   ondansetron 4 MG tablet Commonly known as: ZOFRAN       TAKE these medications    promethazine 25 MG  tablet Commonly known as: PHENERGAN Take 1 tablet (25 mg total) by mouth every 6 (six) hours as needed for nausea or vomiting.         Camelia Eng, MSN, CNM 08/23/2021 3:12 PM

## 2021-08-26 LAB — GC/CHLAMYDIA PROBE AMP (~~LOC~~) NOT AT ARMC
Chlamydia: NEGATIVE
Comment: NEGATIVE
Comment: NORMAL
Neisseria Gonorrhea: NEGATIVE

## 2021-09-09 DIAGNOSIS — Z3480 Encounter for supervision of other normal pregnancy, unspecified trimester: Secondary | ICD-10-CM | POA: Insufficient documentation

## 2021-09-10 ENCOUNTER — Other Ambulatory Visit: Payer: Self-pay

## 2021-09-10 ENCOUNTER — Ambulatory Visit (INDEPENDENT_AMBULATORY_CARE_PROVIDER_SITE_OTHER): Payer: Medicaid Other | Admitting: Advanced Practice Midwife

## 2021-09-10 ENCOUNTER — Other Ambulatory Visit (HOSPITAL_COMMUNITY)
Admission: RE | Admit: 2021-09-10 | Discharge: 2021-09-10 | Disposition: A | Discharge status: Home / Self Care | Payer: Medicaid Other | Source: Ambulatory Visit | Attending: Advanced Practice Midwife | Admitting: Advanced Practice Midwife

## 2021-09-10 ENCOUNTER — Encounter: Payer: Self-pay | Admitting: Advanced Practice Midwife

## 2021-09-10 VITALS — BP 120/77 | HR 104 | Wt 115.0 lb

## 2021-09-10 DIAGNOSIS — Z3480 Encounter for supervision of other normal pregnancy, unspecified trimester: Secondary | ICD-10-CM

## 2021-09-10 DIAGNOSIS — N6322 Unspecified lump in the left breast, upper inner quadrant: Secondary | ICD-10-CM | POA: Diagnosis not present

## 2021-09-10 DIAGNOSIS — N6011 Diffuse cystic mastopathy of right breast: Secondary | ICD-10-CM

## 2021-09-10 DIAGNOSIS — O219 Vomiting of pregnancy, unspecified: Secondary | ICD-10-CM

## 2021-09-10 DIAGNOSIS — Z3A10 10 weeks gestation of pregnancy: Secondary | ICD-10-CM | POA: Diagnosis not present

## 2021-09-10 DIAGNOSIS — N6012 Diffuse cystic mastopathy of left breast: Secondary | ICD-10-CM

## 2021-09-10 DIAGNOSIS — O99611 Diseases of the digestive system complicating pregnancy, first trimester: Secondary | ICD-10-CM | POA: Diagnosis not present

## 2021-09-10 DIAGNOSIS — K5900 Constipation, unspecified: Secondary | ICD-10-CM

## 2021-09-10 NOTE — Progress Notes (Signed)
DATING AND VIABILITY SONOGRAM   Kelly Allen is a 25 y.o. year old G2P1001 with LMP Patient's last menstrual period was 07/06/2021 (approximate). which would correlate to  [redacted]w[redacted]d weeks gestation.  She has regular menstrual cycles.   She is here today for a confirmatory initial sonogram.    GESTATION: SINGLETON-Yes     FETAL ACTIVITY:          Heart rate         167 BPM          The fetus is active.          ADNEXA: The ovaries are normal.   GESTATIONAL AGE AND  BIOMETRICS:  Gestational criteria: Estimated Date of Delivery: 04/07/22 by early ultrasound now at [redacted]w[redacted]d  Previous Scans:0      CROWN RUMP LENGTH           33.13 mm         10 weeks 1d                                                                               AVERAGE EGA(BY THIS SCAN):  10 weeks 1d  WORKING EDD( early ultrasound ):  04/07/22     TECHNICIAN COMMENTS:  Normal appearing single IUP for gestational age.  A copy of this report including all images has been saved and backed up to a second source for retrieval if needed. All measures and details of the anatomical scan, placentation, fluid volume and pelvic anatomy are contained in that report.  Kelly Allen 09/10/2021 1:55 PM

## 2021-09-10 NOTE — Progress Notes (Signed)
Subjective:   Kelly Allen is a 25 y.o. G2P1001 with unsure LMP at [redacted]w[redacted]d by early ultrasound being seen today for her first obstetrical visit.  Her obstetrical history is significant for  NSVD x 1  and has Supervision of other normal pregnancy, antepartum on their problem list.. Patient does intend to breast feed. Pregnancy history fully reviewed.  Patient reports  lumps in left breast, breast tenderness bilaterally, nausea improved with Phenergan .  HISTORY: OB History  Gravida Para Term Preterm AB Living  2 1 1  0 0 1  SAB IAB Ectopic Multiple Live Births  0 0 0 0 1    # Outcome Date GA Lbr Len/2nd Weight Sex Delivery Anes PTL Lv  2 Current           1 Term 08/13/19 [redacted]w[redacted]d   F Vag-Spont   LIV   Past Medical History:  Diagnosis Date   Acid reflux    IBS (irritable bowel syndrome)    Past Surgical History:  Procedure Laterality Date   CHOLECYSTECTOMY     WISDOM TOOTH EXTRACTION     Family History  Problem Relation Age of Onset   Diabetes Mother    Asthma Father    COPD Father    Cancer Paternal Grandmother        stomach   Social History   Tobacco Use   Smoking status: Every Day    Packs/day: 0.25    Years: 7.00    Pack years: 1.75    Types: Cigarettes   Smokeless tobacco: Never  Vaping Use   Vaping Use: Former   Substances: Nicotine  Substance Use Topics   Alcohol use: Not Currently   Drug use: Not Currently   No Known Allergies Current Outpatient Medications on File Prior to Visit  Medication Sig Dispense Refill   acetaminophen (TYLENOL) 325 MG tablet Take by mouth.     Prenatal Vit-Fe Fumarate-FA (PRENATAL VITAMIN PO) Take by mouth.     promethazine (PHENERGAN) 25 MG tablet Take 1 tablet (25 mg total) by mouth every 6 (six) hours as needed for nausea or vomiting. 30 tablet 0   No current facility-administered medications on file prior to visit.     Indications for ASA therapy (per uptodate) One of the following: Previous pregnancy with  preeclampsia, especially early onset and with an adverse outcome No Multifetal gestation No Chronic hypertension No Type 1 or 2 diabetes mellitus No Chronic kidney disease No Autoimmune disease (antiphospholipid syndrome, systemic lupus erythematosus) No   Two or more of the following: Nulliparity No Obesity (body mass index >30 kg/m2) No Family history of preeclampsia in mother or sister No Age ?35 years No Sociodemographic characteristics (African American race, low socioeconomic level) No Personal risk factors (eg, previous pregnancy with low birth weight or small for gestational age infant, previous adverse pregnancy outcome [eg, stillbirth], interval >10 years between pregnancies) No   Indications for early 1 hour GTT (per uptodate)  BMI >25 (>23 in Asian women) AND one of the following   No: BMI 22   Exam   Vitals:   09/10/21 1340  BP: 120/77  Pulse: (!) 104  Weight: 115 lb (52.2 kg)   Fetal Heart Rate (bpm): 167  VS reviewed, nursing note reviewed,  Constitutional: well developed, well nourished, no distress HEENT: normocephalic CV: normal rate Pulm/chest wall: normal effort Breast Exam:  right breast normal with multiple smooth, mobile, ropelike masses, no skin or nipple changes or axillary nodes, left breast  normal with multiple smooth, mobile, ropelike masses, no skin or nipple changes or axillary nodes Abdomen: soft Neuro: alert and oriented x 3 Skin: warm, dry Psych: affect normal Pelvic exam: Deferred, vaginal cultures collected by blind swab   Assessment:   Pregnancy: G2P1001 Patient Active Problem List   Diagnosis Date Noted   Supervision of other normal pregnancy, antepartum 09/09/2021     Plan:  1. Supervision of other normal pregnancy, antepartum --Anticipatory guidance about next visits/weeks of pregnancy given. --[place] for dating given unsure LMP --Next visit in 4 weeks, 2 weeks for NIPS testing if desired --Pap in 2020, wnl, due  postpartum  - Obstetric panel - HIV antibody (with reflex) - Hepatitis C Antibody - Culture, OB Urine - Urine cytology ancillary only([place]) - [facility] AMB BABYSCRIPTS OPT IN - Hemoglobinopathy Evaluation  2. Mass of upper inner quadrant of left breast --Likely benign fibrocystic changes, noted on exam bilaterally.  Pt palpates larger mass on left so breast imaging to confirm. - US BREAST ASPIRATION LEFT; Future  3. Fibrocystic breast changes of both breasts   4. Constipation during pregnancy in first trimester --Discussed dietary changes and addition of probiotics --Pt with  hx IBS-D and C so will not treat with softeners or laxatives initially  5. Nausea and vomiting during pregnancy prior to [redacted] weeks gestation --Managing well with Phenergan Rx at this time  Initial labs drawn. Continue prenatal vitamins. Discussed and offered genetic screening options, including Quad screen/AFP, NIPS testing, and option to decline testing. Benefits/risks/alternatives reviewed. Pt aware that anatomy US is form of genetic screening with lower accuracy in detecting trisomies than blood work.  Pt chooses genetic screening today. NIPS: requested. Ultrasound discussed; fetal anatomic survey: requested. Problem list reviewed and updated. The nature of [facility] with multiple MDs and other Advanced Practice Providers was explained to patient; also emphasized that residents, students are part of our team. Routine obstetric precautions reviewed. Return in about 4 weeks (around 10/08/2021).   Sharen Counter, CNM 09/10/21 3:57 PM

## 2021-09-11 LAB — URINE CYTOLOGY ANCILLARY ONLY
Chlamydia: NEGATIVE
Comment: NEGATIVE
Comment: NORMAL
Neisseria Gonorrhea: NEGATIVE

## 2021-09-12 LAB — HEMOGLOBINOPATHY EVALUATION
Fetal Hemoglobin Testing: <1 % (ref 0.0–1.9)
HCT: 41.1 % (ref 35.0–45.0)
Hemoglobin A2 - HGBRFX: 2.5 % (ref 2.2–3.2)
Hemoglobin: 13.8 g/dL (ref 11.7–15.5)
Hgb A: 97.5 % (ref >96.0)
MCH: 30.6 pg (ref 27.0–33.0)
MCV: 91.1 fL (ref 80.0–100.0)
RBC: 4.51 10*6/uL (ref 3.80–5.10)
RDW: 12.5 % (ref 11.0–15.0)

## 2021-09-12 LAB — OBSTETRIC PANEL
Absolute Monocytes: 543 cells/uL (ref 200–950)
Antibody Screen: NOT DETECTED
Basophils Absolute: 19 cells/uL (ref 0–200)
Basophils Relative: 0.2 %
Eosinophils Absolute: 78 cells/uL (ref 15–500)
Eosinophils Relative: 0.8 %
HCT: 41 % (ref 35.0–45.0)
Hemoglobin: 14.3 g/dL (ref 11.7–15.5)
Hepatitis B Surface Ag: NONREACTIVE
Lymphs Abs: 1853 cells/uL (ref 850–3900)
MCH: 31.6 pg (ref 27.0–33.0)
MCHC: 34.9 g/dL (ref 32.0–36.0)
MCV: 90.5 fL (ref 80.0–100.0)
MPV: 10.5 fL (ref 7.5–12.5)
Monocytes Relative: 5.6 %
Neutro Abs: 7207 cells/uL (ref 1500–7800)
Neutrophils Relative %: 74.3 %
Platelets: 294 10*3/uL (ref 140–400)
RBC: 4.53 10*6/uL (ref 3.80–5.10)
RDW: 12.3 % (ref 11.0–15.0)
RPR Ser Ql: NONREACTIVE
Rubella: 5.12 Index
Total Lymphocyte: 19.1 %
WBC: 9.7 10*3/uL (ref 3.8–10.8)

## 2021-09-12 LAB — HIV ANTIBODY (ROUTINE TESTING W REFLEX): HIV 1&2 Ab, 4th Generation: NONREACTIVE

## 2021-09-12 LAB — HEPATITIS C ANTIBODY
Hepatitis C Ab: NONREACTIVE
SIGNAL TO CUT-OFF: 0.01 (ref <1.00)

## 2021-09-13 LAB — URINE CULTURE, OB REFLEX

## 2021-09-13 LAB — CULTURE, OB URINE

## 2021-09-17 ENCOUNTER — Other Ambulatory Visit: Payer: Self-pay | Admitting: Advanced Practice Midwife

## 2021-09-17 DIAGNOSIS — N6322 Unspecified lump in the left breast, upper inner quadrant: Secondary | ICD-10-CM

## 2021-09-24 ENCOUNTER — Ambulatory Visit (INDEPENDENT_AMBULATORY_CARE_PROVIDER_SITE_OTHER): Payer: Medicaid Other | Admitting: *Deleted

## 2021-09-24 ENCOUNTER — Other Ambulatory Visit: Payer: Self-pay

## 2021-09-24 DIAGNOSIS — Z36 Encounter for antenatal screening for chromosomal anomalies: Secondary | ICD-10-CM

## 2021-09-24 DIAGNOSIS — Z3480 Encounter for supervision of other normal pregnancy, unspecified trimester: Secondary | ICD-10-CM

## 2021-09-24 NOTE — Progress Notes (Signed)
Pt here for NIPTS only blood draw.

## 2021-10-02 ENCOUNTER — Encounter: Payer: Self-pay | Admitting: *Deleted

## 2021-10-02 DIAGNOSIS — Z3480 Encounter for supervision of other normal pregnancy, unspecified trimester: Secondary | ICD-10-CM

## 2021-10-02 NOTE — Progress Notes (Signed)
Pt notified of normal NIPTS

## 2021-10-07 ENCOUNTER — Encounter: Payer: Self-pay | Admitting: *Deleted

## 2021-10-07 DIAGNOSIS — Z3480 Encounter for supervision of other normal pregnancy, unspecified trimester: Secondary | ICD-10-CM

## 2021-10-08 ENCOUNTER — Encounter: Payer: Self-pay | Admitting: Obstetrics and Gynecology

## 2021-10-08 ENCOUNTER — Telehealth (INDEPENDENT_AMBULATORY_CARE_PROVIDER_SITE_OTHER): Payer: Medicaid Other | Admitting: Obstetrics and Gynecology

## 2021-10-08 ENCOUNTER — Other Ambulatory Visit: Payer: Self-pay

## 2021-10-08 DIAGNOSIS — Z3A14 14 weeks gestation of pregnancy: Secondary | ICD-10-CM

## 2021-10-08 DIAGNOSIS — Z3482 Encounter for supervision of other normal pregnancy, second trimester: Secondary | ICD-10-CM

## 2021-10-08 DIAGNOSIS — Z3480 Encounter for supervision of other normal pregnancy, unspecified trimester: Secondary | ICD-10-CM

## 2021-10-08 MED ORDER — ONDANSETRON 8 MG PO TBDP: 8.0000 mg | ORAL_TABLET | Freq: Three times a day (TID) | ORAL | 3 refills | Status: DC | PRN

## 2021-10-08 NOTE — Progress Notes (Signed)
Pt has noticed increase in discharge- no odor or itching Pt will take BP and send it through MyChart message

## 2021-10-08 NOTE — Progress Notes (Signed)
    TELEHEALTH OBSTETRICS VISIT ENCOUNTER NOTE  Provider location: Center for [facility] [place]   Patient location: Home  I connected with Kelly Allen on 10/08/21 at  3:30 PM EST by telephone at home and verified that I am speaking with the correct person using two identifiers. Patient is at home, provider is in the office.  Patient missed her appointment today and was re-scheduled as virtual.    I discussed the limitations, risks, security and privacy concerns of performing an evaluation and management service by telephone and the availability of in person appointments. I also discussed with the patient that there may be a patient responsible charge related to this service. The patient expressed understanding and agreed to proceed.  Subjective:  Kelly Allen is a 25 y.o. G2P1001 at [redacted]w[redacted]d being followed for ongoing prenatal care.  She is currently monitored for the following issues for this low-risk pregnancy and has Supervision of other normal pregnancy, antepartum on their problem list.  Patient reports no complaints. Reports fetal movement. Denies any contractions, bleeding or leaking of fluid.   The following portions of the patient's history were reviewed and updated as appropriate: allergies, current medications, past family history, past medical history, past social history, past surgical history and problem list.   Objective:  Last menstrual period 07/06/2021, currently breastfeeding. General:  Alert, oriented and cooperative.   Mental Status: Normal mood and affect perceived. Normal judgment and thought content.  Rest of physical exam deferred due to type of encounter  Assessment and Plan:  Pregnancy: G2P1001 at [redacted]w[redacted]d 1. Supervision of other normal pregnancy, antepartum  Continues to have N/v, phenergan makes her very tired.  Rx: Zofran  MFM [place] ordered & scheduled   Preterm labor symptoms and general obstetric precautions including but not limited to vaginal  bleeding, contractions, leaking of fluid and fetal movement were reviewed in detail with the patient.  I discussed the assessment and treatment plan with the patient. The patient was provided an opportunity to ask questions and all were answered. The patient agreed with the plan and demonstrated an understanding of the instructions. The patient was advised to call back or seek an in-person office evaluation/go to MAU at [facility] for any urgent or concerning symptoms. Please refer to After Visit Summary for other counseling recommendations.   I provided 10 minutes of non-face-to-face time during this encounter.  Return in about 4 weeks (around 11/05/2021).  Future Appointments  Date Time Provider Department Center  10/09/2021 12:40 PM [facility] [place] 2 [facility] GI-BREAST CE  11/07/2021  1:50 PM Milas Hock, MD [facility]  11/11/2021  1:45 PM [facility] US5 [facility]    Venia Carbon, NP Center for [employer], [facility]

## 2021-10-09 ENCOUNTER — Ambulatory Visit
Admission: RE | Admit: 2021-10-09 | Discharge: 2021-10-09 | Disposition: A | Discharge status: Home / Self Care | Payer: Medicaid Other | Source: Ambulatory Visit | Attending: Advanced Practice Midwife | Admitting: Advanced Practice Midwife

## 2021-10-09 DIAGNOSIS — N6322 Unspecified lump in the left breast, upper inner quadrant: Secondary | ICD-10-CM

## 2021-11-07 ENCOUNTER — Encounter: Payer: Medicaid Other | Admitting: Obstetrics and Gynecology

## 2021-11-07 NOTE — Progress Notes (Deleted)
   PRENATAL VISIT NOTE  Subjective:  Kelly Allen is a 25 y.o. G2P1001 at [redacted]w[redacted]d being seen today for ongoing prenatal care.  She is currently monitored for the following issues for this low-risk pregnancy and has Supervision of other normal pregnancy, antepartum on their problem list.  Patient reports no complaints.   .  .   . Denies leaking of fluid.   The following portions of the patient's history were reviewed and updated as appropriate: allergies, current medications, past family history, past medical history, past social history, past surgical history and problem list.   Objective:  There were no vitals filed for this visit.  Fetal Status:           General:  Alert, oriented and cooperative. Patient is in no acute distress.  Skin: Skin is warm and dry. No rash noted.   Cardiovascular: Normal heart rate noted  Respiratory: Normal respiratory effort, no problems with respiration noted  Abdomen: Soft, gravid, appropriate for gestational age.        Pelvic: Cervical exam deferred        Extremities: Normal range of motion.     Mental Status: Normal mood and affect. Normal behavior. Normal judgment and thought content.   Assessment and Plan:  Pregnancy: G2P1001 at [redacted]w[redacted]d 1. Supervision of other normal pregnancy, antepartum - Anatomy scan scheduled for 12/12 - MSAFP offered and recommended today - Recommended and offered flu shot today, pt ***  Preterm labor symptoms and general obstetric precautions including but not limited to vaginal bleeding, contractions, leaking of fluid and fetal movement were reviewed in detail with the patient. Please refer to After Visit Summary for other counseling recommendations.   No follow-ups on file.  Future Appointments  Date Time Provider Department Center  11/07/2021  1:50 PM Milas Hock, MD [facility]  11/11/2021  1:45 PM [facility]    Milas Hock, MD

## 2021-11-11 ENCOUNTER — Ambulatory Visit: Payer: Medicaid Other | Attending: Obstetrics and Gynecology

## 2021-11-11 ENCOUNTER — Other Ambulatory Visit: Payer: Self-pay

## 2021-11-11 DIAGNOSIS — Z3480 Encounter for supervision of other normal pregnancy, unspecified trimester: Secondary | ICD-10-CM | POA: Diagnosis not present

## 2021-11-12 ENCOUNTER — Ambulatory Visit (INDEPENDENT_AMBULATORY_CARE_PROVIDER_SITE_OTHER): Payer: Medicaid Other | Admitting: Advanced Practice Midwife

## 2021-11-12 VITALS — BP 98/58 | HR 69 | Wt 118.0 lb

## 2021-11-12 DIAGNOSIS — Z3480 Encounter for supervision of other normal pregnancy, unspecified trimester: Secondary | ICD-10-CM

## 2021-11-12 DIAGNOSIS — R42 Dizziness and giddiness: Secondary | ICD-10-CM

## 2021-11-12 NOTE — Progress Notes (Signed)
Pt c/o pelvic pain and rib pain Pt states she feels dizzy a lot

## 2021-11-12 NOTE — Progress Notes (Signed)
° °  PRENATAL VISIT NOTE  Subjective:  Kelly Allen is a 25 y.o. G2P1001 at [redacted]w[redacted]d being seen today for ongoing prenatal care.  She is currently monitored for the following issues for this low-risk pregnancy and has Supervision of other normal pregnancy, antepartum on their problem list.  Patient reports  dizziness intermittently .  Contractions: Not present. Vag. Bleeding: None.  Movement: Present. Denies leaking of fluid.   The following portions of the patient's history were reviewed and updated as appropriate: allergies, current medications, past family history, past medical history, past social history, past surgical history and problem list.   Objective:   Vitals:   11/12/21 1333  BP: (!) 98/58  Pulse: 69  Weight: 118 lb (53.5 kg)    Fetal Status: Fetal Heart Rate (bpm): 143   Movement: Present     General:  Alert, oriented and cooperative. Patient is in no acute distress.  Skin: Skin is warm and dry. No rash noted.   Cardiovascular: Normal heart rate noted  Respiratory: Normal respiratory effort, no problems with respiration noted  Abdomen: Soft, gravid, appropriate for gestational age.  Pain/Pressure: Present     Pelvic: Cervical exam deferred        Extremities: Normal range of motion.  Edema: None  Mental Status: Normal mood and affect. Normal behavior. Normal judgment and thought content.   Assessment and Plan:  Pregnancy: G2P1001 at [redacted]w[redacted]d 1. Supervision of other normal pregnancy, antepartum --Anticipatory guidance about next visits/weeks of pregnancy given. --Reviewed normal [place] yesterday --Next visit in 4 weeks   2. Dizziness --Hgb 13 at NOB but pt with dizziness and fatigue. No cardiac history, no SOB with dizziness.  Pt  reports eating and drinking normally. --Recommend increasing protiein and PO fluids --May be orthostatic hypotension/physiologic changes of pregnancy --Will evaluate and pt given warning signs/reasons to seek care  - CBC - Comprehensive  metabolic panel  Preterm labor symptoms and general obstetric precautions including but not limited to vaginal bleeding, contractions, leaking of fluid and fetal movement were reviewed in detail with the patient. Please refer to After Visit Summary for other counseling recommendations.   No follow-ups on file.  Future Appointments  Date Time Provider Department Center  12/10/2021  1:30 PM Leftwich-Kirby, Wilmer Floor, CNM [facility]    Sharen Counter, CNM

## 2021-11-13 LAB — COMPREHENSIVE METABOLIC PANEL
AG Ratio: 1.5 (calc) (ref 1.0–2.5)
ALT: 6 U/L (ref 6–29)
AST: 9 U/L — ABNORMAL LOW (ref 10–30)
Albumin: 3.8 g/dL (ref 3.6–5.1)
Alkaline phosphatase (APISO): 43 U/L (ref 31–125)
BUN/Creatinine Ratio: 12 (calc) (ref 6–22)
BUN: 6 mg/dL — ABNORMAL LOW (ref 7–25)
CO2: 23 mmol/L (ref 20–32)
Calcium: 8.6 mg/dL (ref 8.6–10.2)
Chloride: 105 mmol/L (ref 98–110)
Creat: 0.51 mg/dL (ref 0.50–0.96)
Globulin: 2.6 g/dL (calc) (ref 1.9–3.7)
Glucose, Bld: 75 mg/dL (ref 65–99)
Potassium: 3.7 mmol/L (ref 3.5–5.3)
Sodium: 138 mmol/L (ref 135–146)
Total Bilirubin: 0.5 mg/dL (ref 0.2–1.2)
Total Protein: 6.4 g/dL (ref 6.1–8.1)

## 2021-11-13 LAB — CBC
HCT: 36.4 % (ref 35.0–45.0)
Hemoglobin: 12.8 g/dL (ref 11.7–15.5)
MCH: 31.8 pg (ref 27.0–33.0)
MCHC: 35.2 g/dL (ref 32.0–36.0)
MCV: 90.5 fL (ref 80.0–100.0)
MPV: 10.7 fL (ref 7.5–12.5)
Platelets: 241 10*3/uL (ref 140–400)
RBC: 4.02 10*6/uL (ref 3.80–5.10)
RDW: 12.5 % (ref 11.0–15.0)
WBC: 10.3 10*3/uL (ref 3.8–10.8)

## 2021-12-01 NOTE — L&D Delivery Note (Signed)
OB/GYN Faculty Practice Delivery Note ? ?Kelly Allen is a 26 y.o. G2P1001 s/p SVD at [redacted]w[redacted]d. She was admitted for SROM/SOL.  ? ?ROM: 10h 53m with clear fluid ?GBS Status: Negative/-- (04/17 0000) ?Maximum Maternal Temperature: 98.11F ? ?Labor Progress: ?Initial SVE: 3/80/-1. She then progressed to complete with largely expectant management (received about 30 minutes of pit earlier that was discontinued).  ? ?Delivery Date/Time: 0017 on 4/29  ?Delivery: Called to room as her CNM was in another delivery. She was complete and +3. Head delivered R OA. Loose nuchal cord present and reduced at the perineum. Shoulder and body delivered in usual fashion. Infant with spontaneous cry, placed on mother's abdomen, dried and stimulated. Cord clamped x 2 after 1-minute delay, and cut by FOB. Cord blood drawn. Placenta delivered spontaneously with gentle cord traction. Fundus firm with massage, lower uterine sweep/massage, and Pitocin. Labia, perineum, vagina, and cervix inspected with a small hemostatic peri-urethral laceration that was not repaired. Due to history of PPH, prophylactic TXA was given.  ?Baby Weight: pending ? ?Placenta: 3 vessel, intact. Sent to L&D ?Complications: None ?Lacerations:  ?EBL: 150 mL ?Analgesia: Epidural  ? ?Infant:  ?APGAR (1 MIN): 8   ?APGAR (5 MINS): 9   ? ?Leticia Penna, DO  ?[facility] Family [facility] Fellow, Faculty Practice ?Center for [employer], [facility] ?03/29/2022, 12:45 AM ? ? ? ?  ?

## 2021-12-10 ENCOUNTER — Encounter: Payer: Medicaid Other | Admitting: Advanced Practice Midwife

## 2021-12-16 ENCOUNTER — Ambulatory Visit (INDEPENDENT_AMBULATORY_CARE_PROVIDER_SITE_OTHER): Payer: Medicaid Other | Admitting: Family Medicine

## 2021-12-16 ENCOUNTER — Other Ambulatory Visit: Payer: Self-pay

## 2021-12-16 DIAGNOSIS — Z3480 Encounter for supervision of other normal pregnancy, unspecified trimester: Secondary | ICD-10-CM

## 2021-12-16 NOTE — Progress Notes (Signed)
° °  PRENATAL VISIT NOTE  Subjective:  Kelly Allen is a 26 y.o. G2P1001 at [redacted]w[redacted]d being seen today for ongoing prenatal care.  She is currently monitored for the following issues for this low-risk pregnancy and has Supervision of other normal pregnancy, antepartum on their problem list.  Patient reports  nausea is significantly improved. Reports some evening contractions which come and go and are not regular or persistent. .  Contractions: Not present. Vag. Bleeding: None.  Movement: Present. Denies leaking of fluid.   The following portions of the patient's history were reviewed and updated as appropriate: allergies, current medications, past family history, past medical history, past social history, past surgical history and problem list.   Objective:   Vitals:   12/16/21 1348  BP: 127/72  Pulse: 84  Weight: 123 lb (55.8 kg)    Fetal Status: Fetal Heart Rate (bpm): 146 Fundal Height: 24 cm Movement: Present     General:  Alert, oriented and cooperative. Patient is in no acute distress.  Skin: Skin is warm and dry. No rash noted.   Cardiovascular: Normal heart rate noted  Respiratory: Normal respiratory effort, no problems with respiration noted  Abdomen: Soft, gravid, appropriate for gestational age.  Pain/Pressure: Present     Pelvic: Cervical exam deferred        Extremities: Normal range of motion.  Edema: None  Mental Status: Normal mood and affect. Normal behavior. Normal judgment and thought content.   Assessment and Plan:  Pregnancy: G2P1001 at [redacted]w[redacted]d 1. Supervision of other normal pregnancy, antepartum Continue routine prenatal care.   Preterm labor symptoms and general obstetric precautions including but not limited to vaginal bleeding, contractions, leaking of fluid and fetal movement were reviewed in detail with the patient. Please refer to After Visit Summary for other counseling recommendations.   Return in 4 weeks (on 01/13/2022) for [facility], 28 wk labs.  Future  Appointments  Date Time Provider Department Center  01/14/2022  8:50 AM Rasch, Harolyn Rutherford, NP [facility]    Reva Bores, MD

## 2021-12-16 NOTE — Patient Instructions (Signed)

## 2021-12-26 ENCOUNTER — Other Ambulatory Visit: Payer: Self-pay | Admitting: Family Medicine

## 2021-12-26 DIAGNOSIS — Z3480 Encounter for supervision of other normal pregnancy, unspecified trimester: Secondary | ICD-10-CM

## 2021-12-26 MED ORDER — CYCLOBENZAPRINE HCL 10 MG PO TABS: 10.0000 mg | ORAL_TABLET | Freq: Three times a day (TID) | ORAL | 1 refills | Status: DC | PRN

## 2022-01-14 ENCOUNTER — Encounter (HOSPITAL_COMMUNITY): Payer: Self-pay | Admitting: Obstetrics & Gynecology

## 2022-01-14 ENCOUNTER — Other Ambulatory Visit (HOSPITAL_COMMUNITY)
Admission: RE | Admit: 2022-01-14 | Discharge: 2022-01-14 | Disposition: A | Discharge status: Home / Self Care | Payer: Medicaid Other | Source: Ambulatory Visit | Attending: Obstetrics and Gynecology | Admitting: Obstetrics and Gynecology

## 2022-01-14 ENCOUNTER — Other Ambulatory Visit: Payer: Self-pay

## 2022-01-14 ENCOUNTER — Ambulatory Visit (INDEPENDENT_AMBULATORY_CARE_PROVIDER_SITE_OTHER): Payer: Medicaid Other | Admitting: Obstetrics and Gynecology

## 2022-01-14 ENCOUNTER — Inpatient Hospital Stay (HOSPITAL_COMMUNITY)
Admission: AD | Admit: 2022-01-14 | Discharge: 2022-01-14 | Disposition: A | Discharge status: Home / Self Care | Payer: Medicaid Other | Attending: Obstetrics & Gynecology | Admitting: Obstetrics & Gynecology

## 2022-01-14 VITALS — BP 107/66 | HR 74 | Wt 126.0 lb

## 2022-01-14 DIAGNOSIS — Z124 Encounter for screening for malignant neoplasm of cervix: Secondary | ICD-10-CM | POA: Insufficient documentation

## 2022-01-14 DIAGNOSIS — O98819 Other maternal infectious and parasitic diseases complicating pregnancy, unspecified trimester: Secondary | ICD-10-CM | POA: Diagnosis not present

## 2022-01-14 DIAGNOSIS — R1032 Left lower quadrant pain: Secondary | ICD-10-CM | POA: Insufficient documentation

## 2022-01-14 DIAGNOSIS — Z0371 Encounter for suspected problem with amniotic cavity and membrane ruled out: Secondary | ICD-10-CM | POA: Diagnosis not present

## 2022-01-14 DIAGNOSIS — B3731 Acute candidiasis of vulva and vagina: Secondary | ICD-10-CM | POA: Diagnosis not present

## 2022-01-14 DIAGNOSIS — Z3A28 28 weeks gestation of pregnancy: Secondary | ICD-10-CM | POA: Insufficient documentation

## 2022-01-14 DIAGNOSIS — R110 Nausea: Secondary | ICD-10-CM | POA: Insufficient documentation

## 2022-01-14 DIAGNOSIS — O479 False labor, unspecified: Secondary | ICD-10-CM | POA: Insufficient documentation

## 2022-01-14 DIAGNOSIS — Z3480 Encounter for supervision of other normal pregnancy, unspecified trimester: Secondary | ICD-10-CM

## 2022-01-14 DIAGNOSIS — Z3689 Encounter for other specified antenatal screening: Secondary | ICD-10-CM | POA: Insufficient documentation

## 2022-01-14 DIAGNOSIS — O98813 Other maternal infectious and parasitic diseases complicating pregnancy, third trimester: Secondary | ICD-10-CM | POA: Insufficient documentation

## 2022-01-14 DIAGNOSIS — O26893 Other specified pregnancy related conditions, third trimester: Secondary | ICD-10-CM | POA: Insufficient documentation

## 2022-01-14 DIAGNOSIS — O219 Vomiting of pregnancy, unspecified: Secondary | ICD-10-CM

## 2022-01-14 LAB — AMNISURE RUPTURE OF MEMBRANE (ROM) NOT AT ARMC: Amnisure ROM: NEGATIVE

## 2022-01-14 LAB — URINALYSIS, ROUTINE W REFLEX MICROSCOPIC
Bilirubin Urine: NEGATIVE
Glucose, UA: NEGATIVE mg/dL
Hgb urine dipstick: NEGATIVE
Ketones, ur: NEGATIVE mg/dL
Nitrite: NEGATIVE
Protein, ur: NEGATIVE mg/dL
Specific Gravity, Urine: 1.015 (ref 1.005–1.030)
pH: 5 (ref 5.0–8.0)

## 2022-01-14 LAB — FETAL FIBRONECTIN: Fetal Fibronectin: NEGATIVE

## 2022-01-14 LAB — WET PREP, GENITAL
Clue Cells Wet Prep HPF POC: NONE SEEN
Sperm: NONE SEEN
Trich, Wet Prep: NONE SEEN
WBC, Wet Prep HPF POC: <10 (ref <10)

## 2022-01-14 MED ORDER — LACTATED RINGERS IV BOLUS
1000.0000 mL | Freq: Once | INTRAVENOUS | Status: AC
  Administered 2022-01-14: 1000 mL via INTRAVENOUS

## 2022-01-14 MED ORDER — ONDANSETRON HCL 4 MG/2ML IJ SOLN
4.0000 mg | Freq: Once | INTRAMUSCULAR | Status: AC
  Administered 2022-01-14: 4 mg via INTRAVENOUS
  Filled 2022-01-14: qty 2

## 2022-01-14 MED ORDER — ACETAMINOPHEN 500 MG PO TABS
1000.0000 mg | ORAL_TABLET | Freq: Once | ORAL | Status: AC
  Administered 2022-01-14: 1000 mg via ORAL
  Filled 2022-01-14: qty 2

## 2022-01-14 MED ORDER — TERCONAZOLE 0.4 % VA CREA: 1.0000 | TOPICAL_CREAM | Freq: Every day | VAGINAL | 0 refills | Status: DC

## 2022-01-14 NOTE — MAU Note (Signed)
Pt sent from office. Reports she has had cramping and ctx on and off all last night could not sleep. Ctx continue to day . Reports she had a gush of fluid yesterday no leaking today. SROM ruled out at appointment. Good fetal movement felt. Denies any vag bleeding

## 2022-01-14 NOTE — MAU Provider Note (Signed)
History     CSN: [id number]  Arrival date and time: 01/14/22 1055   Event Date/Time   First Provider Initiated Contact with Patient 01/14/22 1150      Chief Complaint  Patient presents with   Contractions   Kelly Allen is a 26 y.o. G2P1001 at [redacted]w[redacted]d who receives care at [facility].  She presents  today for Contractions.  She reports she had a gush of fluid yesterday afternoon, with contractions starting around 2130-2200 and progressing throughout the night.  She reports the are located in the lower pelvic area and she describes them as "super sharp and sometimes I feel pressure pushing down."  She endorses fetal movement and denies issues with urination, constipation, or diarrhea.  She states she has noted some leaking today.  She rates her pain a 6-7/10 and reports this is an improvement.  She reports the pain is worsened with side lying position, but has no relieving factors. She reports having a "little bit" of water today.  She reports she has not taken any medications today.     OB History     Gravida  2   Para  1   Term  1   Preterm      AB      Living  1      SAB      IAB      Ectopic      Multiple      Live Births  1           Past Medical History:  Diagnosis Date   Acid reflux    IBS (irritable bowel syndrome)     Past Surgical History:  Procedure Laterality Date   CHOLECYSTECTOMY     WISDOM TOOTH EXTRACTION      Family History  Problem Relation Age of Onset   Diabetes Mother    Asthma Father    COPD Father    Cancer Paternal Grandmother        stomach    Social History   Tobacco Use   Smoking status: Every Day    Packs/day: 0.25    Years: 7.00    Pack years: 1.75    Types: Cigarettes   Smokeless tobacco: Never  Vaping Use   Vaping Use: Former   Substances: Nicotine  Substance Use Topics   Alcohol use: Not Currently   Drug use: Not Currently    Allergies: No Known Allergies  Medications Prior to Admission  Medication Sig  Dispense Refill Last Dose   cyclobenzaprine (FLEXERIL) 10 MG tablet Take 1 tablet (10 mg total) by mouth every 8 (eight) hours as needed for muscle spasms. 30 tablet 1 Past Month   ondansetron (ZOFRAN ODT) 8 MG disintegrating tablet Take 1 tablet (8 mg total) by mouth every 8 (eight) hours as needed for nausea or vomiting. 20 tablet 3 Past Week   Prenatal Vit-Fe Fumarate-FA (PRENATAL VITAMIN PO) Take by mouth.   01/14/2022   promethazine (PHENERGAN) 25 MG tablet Take 1 tablet (25 mg total) by mouth every 6 (six) hours as needed for nausea or vomiting. (Patient not taking: Reported on 11/12/2021) 30 tablet 0     Review of Systems  Constitutional:  Negative for chills and fever.  Gastrointestinal:  Positive for abdominal pain (Lower), nausea and vomiting. Negative for constipation and diarrhea.  Genitourinary:  Negative for difficulty urinating, dysuria, vaginal bleeding and vaginal discharge.  Neurological:  Positive for light-headedness and headaches (None currently). Negative for dizziness.  Physical Exam   Blood pressure 107/70, pulse 89, temperature 98.8 F (37.1 C), resp. rate 18, last menstrual period 07/06/2021, currently breastfeeding.  Physical Exam Vitals reviewed.  Constitutional:      Appearance: Normal appearance.  HENT:     Head: Normocephalic and atraumatic.  Eyes:     Conjunctiva/sclera: Conjunctivae normal.  Cardiovascular:     Rate and Rhythm: Normal rate and regular rhythm.     Heart sounds: Normal heart sounds.  Pulmonary:     Effort: Pulmonary effort is normal. No respiratory distress.     Breath sounds: Normal breath sounds.  Abdominal:     General: Bowel sounds are normal.     Palpations: Abdomen is soft.     Tenderness: There is abdominal tenderness in the left lower quadrant. There is no right CVA tenderness or left CVA tenderness.  Musculoskeletal:        General: Normal range of motion.     Cervical back: Normal range of motion.  Skin:    General: Skin  is warm and dry.  Neurological:     Mental Status: She is alert and oriented to person, place, and time.  Psychiatric:        Mood and Affect: Mood normal.        Behavior: Behavior normal.        Thought Content: Thought content normal.    Fetal Assessment 145 bpm, Mod Var, -Decels, +Accels Toco: Mild Irritability Graphed  MAU Course   Results for orders placed or performed during the hospital encounter of 01/14/22 (from the past 24 hour(s))  Urinalysis, Routine w reflex microscopic Urine, Clean Catch     Status: Abnormal   Collection Time: 01/14/22 11:24 AM  Result Value Ref Range   Color, Urine YELLOW YELLOW   APPearance HAZY (A) CLEAR   Specific Gravity, Urine 1.015 1.005 - 1.030   pH 5.0 5.0 - 8.0   Glucose, UA NEGATIVE NEGATIVE mg/dL   Hgb urine dipstick NEGATIVE NEGATIVE   Bilirubin Urine NEGATIVE NEGATIVE   Ketones, ur NEGATIVE NEGATIVE mg/dL   Protein, ur NEGATIVE NEGATIVE mg/dL   Nitrite NEGATIVE NEGATIVE   Leukocytes,Ua TRACE (A) NEGATIVE   RBC / HPF 0-5 0 - 5 RBC/hpf   WBC, UA 0-5 0 - 5 WBC/hpf   Bacteria, UA FEW (A) NONE SEEN   Squamous Epithelial / LPF 0-5 0 - 5   Mucus PRESENT   Amnisure rupture of membrane (rom)not at [facility]     Status: None   Collection Time: 01/14/22 12:14 PM  Result Value Ref Range   Amnisure ROM NEGATIVE   Wet prep, genital     Status: Abnormal   Collection Time: 01/14/22 12:14 PM   Specimen: Vaginal  Result Value Ref Range   Yeast Wet Prep HPF POC PRESENT (A) NONE SEEN   Trich, Wet Prep NONE SEEN NONE SEEN   Clue Cells Wet Prep HPF POC NONE SEEN NONE SEEN   WBC, Wet Prep HPF POC <10 <10   Sperm NONE SEEN    No results found.  MDM PE Labs: UA, Wet Prep, Amnisure EFM Analgesic AntiEmetic Assessment and Plan  26 year old G2P1001  SIUP at 28.1 weeks Cat I FT Abdominal Pain Nausea  -Office notes reviewed. GC/CT, fFN Pending -POC Reviewed -Exam performed. -Nurse to collect wet prep and amnisure. -Discussed IV hydration  and treatment for nausea. -Discussed pain medication and patient agreeable with all interventions. -NST Reactive. -Will continue to monitor and reassess.  Shanda Bumps  Fraser Din MSN, CNM 01/14/2022, 11:50 AM    Reassessment (1:29 PM) Candidiasis of Vagina  -Patient reports improvement in pain with tylenol dosing and fluid. -Cervical exam closed.  -Informed of yeast findings and treatment. -Reviewed rest and hydration while at home. -Discussed return precautions.  -Patient to follow up as scheduled. -Encouraged to call primary office or return to MAU if symptoms worsen or with the onset of new symptoms. -Discharged to home in stable condition.  Cherre Robins MSN, CNM Advanced Practice Provider, Center for [employer]

## 2022-01-14 NOTE — Addendum Note (Signed)
Addended by: Granville Symanski on: 01/14/2022 09:13 AM   Modules accepted: Orders

## 2022-01-14 NOTE — Progress Notes (Signed)
° °  PRENATAL VISIT NOTE  Subjective:  Kelly Allen is a 26 y.o. G2P1001 at [redacted]w[redacted]d being seen today for ongoing prenatal care.  She is currently monitored for the following issues for this low-risk pregnancy and has Supervision of other normal pregnancy, antepartum on their problem list.  Patient reports backache and contractions with leaking of fluid .  Contractions: Irritability. Vag. Bleeding: None.  Movement: Present. Denies leaking of fluid.   Reports one gush of clear fluid yesterday which soaked her underwear. She reports lower pelvic pain x 1 week with worsening pain starting last night. The pain did keep her awake last night and she only slept for 1 hour total. + fetal movement. She did not have to put on a pad this morning.   The following portions of the patient's history were reviewed and updated as appropriate: allergies, current medications, past family history, past medical history, past social history, past surgical history and problem list.   Objective:   Vitals:   01/14/22 0824  BP: 107/66  Pulse: 74  Weight: 126 lb (57.2 kg)    Fetal Status: Fetal Heart Rate (bpm): 136   Movement: Present     General:  Alert, oriented and cooperative. Patient is in no acute distress.  Skin: Skin is warm and dry. No rash noted.   Cardiovascular: Normal heart rate noted  Respiratory: Normal respiratory effort, no problems with respiration noted  Abdomen: Soft, gravid, appropriate for gestational age.  Pain/Pressure: Present     Pelvic: Cervical exam performed in the presence of a chaperone      Vagina - Small amount of white vaginal discharge, no odor, no pooling of fluid.  Cervix - visibly closed.  Bimanual exam: Cervix external os FT, thick, middle GC/Chlam, wet prep, FFN, and pap collected  Chaperone present for exam.    Extremities: Normal range of motion.  Edema: Trace  Mental Status: Normal mood and affect. Normal behavior. Normal judgment and thought content.   Assessment  and Plan:  Pregnancy: G2P1001 at [redacted]w[redacted]d  1. Cervical cancer screening  -Pap due and collected today.  2. Encounter for suspected premature rupture of amniotic membranes, with rupture of membranes not found  -No pooling of fluid   3. Braxton Hicks contractions  -FFN pending- No recent intercourse.  -Discussed with patient that going to MAU for preterm labor evaluation is best. Patient is agreeable. Reviewed patient with Cleone Slim, CNM in MAU.  Patient is going now. - patient will reschedule 2 hour GTT  Preterm labor symptoms and general obstetric precautions including but not limited to vaginal bleeding, contractions, leaking of fluid and fetal movement were reviewed in detail with the patient. Please refer to After Visit Summary for other counseling recommendations.   No follow-ups on file.  Future Appointments  Date Time Provider Department Center  01/14/2022  8:50 AM Jabir Dahlem, Harolyn Rutherford, NP [facility]    Venia Carbon, NP

## 2022-01-14 NOTE — Progress Notes (Signed)
Pt had a gush of fluid yesterday and now having lower pelvic pain

## 2022-01-15 LAB — CULTURE, OB URINE: Culture: NO GROWTH

## 2022-01-16 LAB — CERVICOVAGINAL ANCILLARY ONLY
Bacterial Vaginitis (gardnerella): NEGATIVE
Candida Glabrata: NEGATIVE
Candida Vaginitis: POSITIVE — AB
Chlamydia: NEGATIVE
Comment: NEGATIVE
Comment: NEGATIVE
Comment: NEGATIVE
Comment: NEGATIVE
Comment: NEGATIVE
Comment: NORMAL
Neisseria Gonorrhea: NEGATIVE
Trichomonas: NEGATIVE

## 2022-01-17 LAB — CYTOLOGY - PAP
Comment: NEGATIVE
Diagnosis: UNDETERMINED — AB
High risk HPV: NEGATIVE

## 2022-01-20 ENCOUNTER — Encounter: Payer: Self-pay | Admitting: Obstetrics and Gynecology

## 2022-01-20 DIAGNOSIS — R87629 Unspecified abnormal cytological findings in specimens from vagina: Secondary | ICD-10-CM | POA: Insufficient documentation

## 2022-01-20 DIAGNOSIS — R87610 Atypical squamous cells of undetermined significance on cytologic smear of cervix (ASC-US): Secondary | ICD-10-CM | POA: Insufficient documentation

## 2022-01-28 ENCOUNTER — Ambulatory Visit (INDEPENDENT_AMBULATORY_CARE_PROVIDER_SITE_OTHER): Payer: Medicaid Other | Admitting: Obstetrics and Gynecology

## 2022-01-28 ENCOUNTER — Other Ambulatory Visit: Payer: Self-pay

## 2022-01-28 ENCOUNTER — Encounter: Payer: Self-pay | Admitting: Obstetrics and Gynecology

## 2022-01-28 DIAGNOSIS — Z3480 Encounter for supervision of other normal pregnancy, unspecified trimester: Secondary | ICD-10-CM

## 2022-01-28 DIAGNOSIS — Z23 Encounter for immunization: Secondary | ICD-10-CM

## 2022-01-28 NOTE — Progress Notes (Signed)
° °  PRENATAL VISIT NOTE  Subjective:  Kelly Allen is a 26 y.o. G2P1001 at [redacted]w[redacted]d being seen today for ongoing prenatal care.  She is currently monitored for the following issues for this low-risk pregnancy and has Supervision of other normal pregnancy, antepartum; Cervical cancer screening; Encounter for suspected premature rupture of amniotic membranes, with rupture of membranes not found; Braxton Hicks contractions; and Abnormal vaginal Pap smear on their problem list.  Patient reports no complaints.  Contractions: Not present. Vag. Bleeding: None.  Movement: Present. Denies leaking of fluid.   The following portions of the patient's history were reviewed and updated as appropriate: allergies, current medications, past family history, past medical history, past social history, past surgical history and problem list.   Objective:   Vitals:   01/28/22 0917  BP: 104/61  Pulse: 89  Weight: 127 lb (57.6 kg)    Fetal Status: Fetal Heart Rate (bpm): 157 Fundal Height: 30 cm Movement: Present     General:  Alert, oriented and cooperative. Patient is in no acute distress.  Skin: Skin is warm and dry. No rash noted.   Cardiovascular: Normal heart rate noted  Respiratory: Normal respiratory effort, no problems with respiration noted  Abdomen: Soft, gravid, appropriate for gestational age.  Pain/Pressure: Present     Pelvic: Cervical exam deferred        Extremities: Normal range of motion.  Edema: Trace  Mental Status: Normal mood and affect. Normal behavior. Normal judgment and thought content.   Assessment and Plan:  Pregnancy: G2P1001 at [redacted]w[redacted]d 1. Supervision of other normal pregnancy, antepartum Patient is doing well without complaints Third trimester labs, glucola and tdap today - 2Hr GTT w/ 1 Hr Carpenter 75 g - HIV antibody (with reflex) - CBC - RPR - Tdap vaccine greater than or equal to 7yo IM  Preterm labor symptoms and general obstetric precautions including but not limited  to vaginal bleeding, contractions, leaking of fluid and fetal movement were reviewed in detail with the patient. Please refer to After Visit Summary for other counseling recommendations.   Return in about 2 weeks (around 02/11/2022) for in person, ROB, Low risk.  Future Appointments  Date Time Provider [place]  01/28/2022 10:30 AM Hanford Lust, Vickii Chafe, MD [facility]    Mora Bellman, MD

## 2022-01-29 LAB — CBC
HCT: 34.4 % — ABNORMAL LOW (ref 35.0–45.0)
Hemoglobin: 11.7 g/dL (ref 11.7–15.5)
MCH: 32.1 pg (ref 27.0–33.0)
MCHC: 34 g/dL (ref 32.0–36.0)
MCV: 94.2 fL (ref 80.0–100.0)
MPV: 10.1 fL (ref 7.5–12.5)
Platelets: 211 10*3/uL (ref 140–400)
RBC: 3.65 10*6/uL — ABNORMAL LOW (ref 3.80–5.10)
RDW: 12.6 % (ref 11.0–15.0)
WBC: 13.7 10*3/uL — ABNORMAL HIGH (ref 3.8–10.8)

## 2022-01-29 LAB — 2HR GTT W 1 HR, CARPENTER, 75 G
Glucose, 1 Hr, Gest: 105 mg/dL (ref 65–179)
Glucose, 2 Hr, Gest: 109 mg/dL (ref 65–152)
Glucose, Fasting, Gest: 84 mg/dL (ref 65–91)

## 2022-01-29 LAB — HIV ANTIBODY (ROUTINE TESTING W REFLEX): HIV 1&2 Ab, 4th Generation: NONREACTIVE

## 2022-01-29 LAB — RPR: RPR Ser Ql: NONREACTIVE

## 2022-02-10 ENCOUNTER — Encounter: Payer: Medicaid Other | Admitting: Family Medicine

## 2022-02-14 ENCOUNTER — Other Ambulatory Visit: Payer: Self-pay

## 2022-02-14 ENCOUNTER — Ambulatory Visit (INDEPENDENT_AMBULATORY_CARE_PROVIDER_SITE_OTHER): Payer: Medicaid Other

## 2022-02-14 VITALS — BP 113/75 | HR 91 | Wt 131.0 lb

## 2022-02-14 DIAGNOSIS — Z3480 Encounter for supervision of other normal pregnancy, unspecified trimester: Secondary | ICD-10-CM

## 2022-02-14 DIAGNOSIS — O26893 Other specified pregnancy related conditions, third trimester: Secondary | ICD-10-CM

## 2022-02-14 DIAGNOSIS — R12 Heartburn: Secondary | ICD-10-CM

## 2022-02-14 LAB — POCT URINALYSIS DIPSTICK
Bilirubin, UA: NEGATIVE
Blood, UA: NEGATIVE
Glucose, UA: NEGATIVE
Ketones, UA: NEGATIVE
Leukocytes, UA: NEGATIVE
Nitrite, UA: NEGATIVE
Protein, UA: NEGATIVE
Spec Grav, UA: 1.025 (ref 1.010–1.025)
Urobilinogen, UA: NEGATIVE E.U./dL — AB
pH, UA: 5 (ref 5.0–8.0)

## 2022-02-14 MED ORDER — ONDANSETRON 8 MG PO TBDP: 8.0000 mg | ORAL_TABLET | Freq: Three times a day (TID) | ORAL | 3 refills | Status: DC | PRN

## 2022-02-14 MED ORDER — FAMOTIDINE 20 MG PO TABS: 20.0000 mg | ORAL_TABLET | Freq: Two times a day (BID) | ORAL | 3 refills | Status: DC

## 2022-02-14 NOTE — Progress Notes (Signed)
? ?  PRENATAL VISIT NOTE ? ?Subjective:  ?Kelly Allen is a 26 y.o. G2P1001 at [redacted]w[redacted]d being seen today for ongoing prenatal care.  She is currently monitored for the following issues for this low-risk pregnancy and has Supervision of other normal pregnancy, antepartum; Cervical cancer screening; Encounter for suspected premature rupture of amniotic membranes, with rupture of membranes not found; Braxton Hicks contractions; and Abnormal vaginal Pap smear on their problem list. ? ?Patient reports irregular braxton hicks contractions, cramping, pelvic pressure and pressure with urination. No leaking fluid, vaginal bleeding or dysuria. Also having heartburn not controlled with Tums. Requesting refill on Zofran. Contractions: Not present. Vag. Bleeding: None.  Movement: Present. Denies leaking of fluid.  ? ?The following portions of the patient's history were reviewed and updated as appropriate: allergies, current medications, past family history, past medical history, past social history, past surgical history and problem list.  ? ?Objective:  ? ?Vitals:  ? 02/14/22 1030  ?BP: 113/75  ?Pulse: 91  ?Weight: 131 lb (59.4 kg)  ? ? ?Fetal Status: Fetal Heart Rate (bpm): 154 Fundal Height: 32 cm Movement: Present    ? ?General:  Alert, oriented and cooperative. Patient is in no acute distress.  ?Skin: Skin is warm and dry. No rash noted.   ?Cardiovascular: Normal heart rate noted  ?Respiratory: Normal respiratory effort, no problems with respiration noted  ?Abdomen: Soft, gravid, appropriate for gestational age.  Pain/Pressure: Present     ?Pelvic: Cervical exam performed in the presence of a chaperone Dilation: Closed Effacement (%): Thick    ?Extremities: Normal range of motion.  Edema: Trace  ?Mental Status: Normal mood and affect. Normal behavior. Normal judgment and thought content.  ? ?Assessment and Plan:  ?Pregnancy: G2P1001 at [redacted]w[redacted]d ?1. Supervision of other normal pregnancy, antepartum ?- Pelvic pain and cramping. FFN  collected and pending. Cervix closed and thick ?- Urine culture sent ?- Zofran rx refilled per patient request ?- Has only had 1/2 cup of water to drink. Encouraged to go home and hydrate/rest. PTL precautions reviewed at length. Patient verbalizes understanding of when to go to hospital.  ? ? ?- POCT Urinalysis Dipstick ?- Culture, OB Urine ?- Fetal fibronectin ?- ondansetron (ZOFRAN ODT) 8 MG disintegrating tablet; Take 1 tablet (8 mg total) by mouth every 8 (eight) hours as needed for nausea or vomiting.  Dispense: 20 tablet; Refill: 3 ? ?2. Heartburn during pregnancy in third trimester ?- Tums not helping ?- Reviewed dietary changes.  ?- Pepcid rx sent ? ?- famotidine (PEPCID) 20 MG tablet; Take 1 tablet (20 mg total) by mouth 2 (two) times daily.  Dispense: 60 tablet; Refill: 3 ? ? ?Preterm labor symptoms and general obstetric precautions including but not limited to vaginal bleeding, contractions, leaking of fluid and fetal movement were reviewed in detail with the patient. ?Please refer to After Visit Summary for other counseling recommendations.  ? ?Return in about 2 weeks (around 02/28/2022). ? ?Future Appointments  ?Date Time Provider Department Center  ?02/28/2022 10:50 AM Rasch, Harolyn Rutherford, NP [facility]  ? ? ?Brand Males, CNM ? ?

## 2022-02-14 NOTE — Progress Notes (Signed)
Pt c/o cramping and increased pressure ?

## 2022-02-15 LAB — FETAL FIBRONECTIN: Fetal Fibronectin: NEGATIVE

## 2022-02-16 LAB — CULTURE, OB URINE

## 2022-02-16 LAB — URINE CULTURE, OB REFLEX: Organism ID, Bacteria: NO GROWTH

## 2022-02-17 ENCOUNTER — Encounter: Payer: Self-pay | Admitting: Advanced Practice Midwife

## 2022-02-28 ENCOUNTER — Ambulatory Visit (INDEPENDENT_AMBULATORY_CARE_PROVIDER_SITE_OTHER): Payer: Medicaid Other | Admitting: Obstetrics and Gynecology

## 2022-02-28 VITALS — BP 106/70 | HR 88 | Wt 132.0 lb

## 2022-02-28 DIAGNOSIS — M5441 Lumbago with sciatica, right side: Secondary | ICD-10-CM

## 2022-02-28 DIAGNOSIS — M5442 Lumbago with sciatica, left side: Secondary | ICD-10-CM

## 2022-02-28 DIAGNOSIS — Z3480 Encounter for supervision of other normal pregnancy, unspecified trimester: Secondary | ICD-10-CM

## 2022-02-28 MED ORDER — CYCLOBENZAPRINE HCL 10 MG PO TABS: 10.0000 mg | ORAL_TABLET | Freq: Three times a day (TID) | ORAL | 1 refills | Status: DC | PRN

## 2022-02-28 NOTE — Progress Notes (Signed)
Pt having lower back pain- states Flexeril isn't helping ?

## 2022-02-28 NOTE — Progress Notes (Signed)
? ?  PRENATAL VISIT NOTE ? ?Subjective:  ?Kelly Allen is a 26 y.o. G2P1001 at [redacted]w[redacted]d being seen today for ongoing prenatal care.  She is currently monitored for the following issues for this low-risk pregnancy and has Supervision of other normal pregnancy, antepartum; Cervical cancer screening; Encounter for suspected premature rupture of amniotic membranes, with rupture of membranes not found; Braxton Hicks contractions; Abnormal vaginal Pap smear; and Acute bilateral low back pain with bilateral sciatica on their problem list. ? ?Patient reports backache.  Contractions: Not present. Vag. Bleeding: None.  Movement: Present. Denies leaking of fluid.  ? ?Back pain started 2 weeks ago. Back pain is constant. The back pain occurs every day and at night. She is not able to lay on her back. She has tried flexeril for the pain which Is not working unless she doubles the dose.  No injury to her back. At times she feels like she needs to limp to walk. She does wear a pregnancy support belt. No problems with bowel or bladder function.  ? ?The following portions of the patient's history were reviewed and updated as appropriate: allergies, current medications, past family history, past medical history, past social history, past surgical history and problem list.  ? ?Objective:  ? ?Vitals:  ? 02/28/22 1055  ?BP: 106/70  ?Pulse: 88  ?Weight: 132 lb (59.9 kg)  ? ? ?Fetal Status: Fetal Heart Rate (bpm): 153 Fundal Height: 36 cm Movement: Present    ? ?General:  Alert, oriented and cooperative. Patient is in no acute distress.  ?Skin: Skin is warm and dry. No rash noted.   ?Cardiovascular: Normal heart rate noted  ?Respiratory: Normal respiratory effort, no problems with respiration noted  ?Abdomen: Soft, gravid, appropriate for gestational age.  Pain/Pressure: Present     ?Pelvic: Cervical exam deferred        ?Extremities: Normal range of motion.  Edema: Trace  ?Mental Status: Normal mood and affect. Normal behavior. Normal  judgment and thought content.  ? ?Assessment and Plan:  ?Pregnancy: G2P1001 at [redacted]w[redacted]d ? ?1. Acute bilateral low back pain with bilateral sciatica ? ?- AMB referral to sports medicine ?- refill on flexeril ?- continue pregnancy support belt ?- Go to MAU if symptoms worsen  ? ?2. Supervision of other normal pregnancy, antepartum ? ?Doing well otherwise ?36w cultures next visit.  ? ?Preterm labor symptoms and general obstetric precautions including but not limited to vaginal bleeding, contractions, leaking of fluid and fetal movement were reviewed in detail with the patient. ?Please refer to After Visit Summary for other counseling recommendations.  ? ?Return in about 2 weeks (around 03/14/2022). ? ?Future Appointments  ?Date Time Provider Department Center  ?03/14/2022  8:50 AM Nugent, Odie Sera, NP [facility]  ?03/17/2022  2:30 PM Monica Becton, MD [facility]  ? ? ?Venia Carbon, NP  ?

## 2022-03-11 ENCOUNTER — Encounter: Payer: Self-pay | Admitting: Advanced Practice Midwife

## 2022-03-14 ENCOUNTER — Ambulatory Visit (INDEPENDENT_AMBULATORY_CARE_PROVIDER_SITE_OTHER): Payer: Medicaid Other | Admitting: Women's Health

## 2022-03-14 ENCOUNTER — Other Ambulatory Visit (HOSPITAL_COMMUNITY)
Admission: RE | Admit: 2022-03-14 | Discharge: 2022-03-14 | Disposition: A | Discharge status: Home / Self Care | Payer: Medicaid Other | Source: Ambulatory Visit | Attending: Women's Health | Admitting: Women's Health

## 2022-03-14 VITALS — BP 124/81 | HR 81 | Wt 137.0 lb

## 2022-03-14 DIAGNOSIS — Z3480 Encounter for supervision of other normal pregnancy, unspecified trimester: Secondary | ICD-10-CM | POA: Insufficient documentation

## 2022-03-14 DIAGNOSIS — R87629 Unspecified abnormal cytological findings in specimens from vagina: Secondary | ICD-10-CM

## 2022-03-14 DIAGNOSIS — Z3A36 36 weeks gestation of pregnancy: Secondary | ICD-10-CM

## 2022-03-14 NOTE — Progress Notes (Signed)
PHQ 9 Score: 5 GAD 7 Score: 5 

## 2022-03-14 NOTE — Patient Instructions (Signed)
Maternity Assessment Unit (MAU)  The Maternity Assessment Unit (MAU) is located at the [facility]. The address is: [address], [address], [place], [place] [address]. Please see map below for additional directions.    The Maternity Assessment Unit is designed to help you during your pregnancy, and for up to 6 weeks after delivery, with any pregnancy- or postpartum-related emergencies, if you think you are in labor, or if your water has broken. For example, if you experience nausea and vomiting, vaginal bleeding, severe abdominal or pelvic pain, elevated blood pressure or other problems related to your pregnancy or postpartum time, please come to the Maternity Assessment Unit for assistance.        

## 2022-03-14 NOTE — Progress Notes (Signed)
Subjective:  ?Kelly Allen is a 26 y.o. G2P1001 at [redacted]w[redacted]d being seen today for ongoing prenatal care.  She is currently monitored for the following issues for this low-risk pregnancy and has Supervision of other normal pregnancy, antepartum; Braxton Hicks contractions; Abnormal vaginal Pap smear; and Acute bilateral low back pain with bilateral sciatica on their problem list. ? ?Patient reports no complaints.  Contractions: Not present. Vag. Bleeding: None.  Movement: Present. Denies leaking of fluid.  ? ?The following portions of the patient's history were reviewed and updated as appropriate: allergies, current medications, past family history, past medical history, past social history, past surgical history and problem list. Problem list updated. ? ?Objective:  ? ?Vitals:  ? 03/14/22 0857  ?BP: 124/81  ?Pulse: 81  ?Weight: 137 lb (62.1 kg)  ? ? ?Fetal Status: Fetal Heart Rate (bpm): 155 Fundal Height: 37 cm Movement: Present    ? ?General:  Alert, oriented and cooperative. Patient is in no acute distress.  ?Skin: Skin is warm and dry. No rash noted.   ?Cardiovascular: Normal heart rate noted  ?Respiratory: Normal respiratory effort, no problems with respiration noted  ?Abdomen: Soft, gravid, appropriate for gestational age. Pain/Pressure: Present     ?Pelvic: Vag. Bleeding: None Vag D/C Character: Thin   ?CE  deferred         ?Extremities: Normal range of motion.  Edema: Trace  ?Mental Status: Normal mood and affect. Normal behavior. Normal judgment and thought content.  ? ?Urinalysis:     ? ?Assessment and Plan:  ?Pregnancy: G2P1001 at [redacted]w[redacted]d ? ?1. Supervision of other normal pregnancy, antepartum ?-PHQ9, GAD7 today (5,5) ?-GBS/swabs today ? ?2. Abnormal vaginal Pap smear ?-ASCUS/HPV neg, continue routine screening per [facility] ? ?Term labor symptoms and general obstetric precautions including but not limited to vaginal bleeding, contractions, leaking of fluid and fetal movement were reviewed in detail with the  patient. ?I discussed the assessment and treatment plan with the patient. The patient was provided an opportunity to ask questions and all were answered. The patient agreed with the plan and demonstrated an understanding of the instructions. The patient was advised to call back or seek an in-person office evaluation/go to MAU at [facility] for any urgent or concerning symptoms. ?Please refer to After Visit Summary for other counseling recommendations.  ?Return in about 1 week (around 03/21/2022) for in-person LOB/APP OK. ? ? ?Luiz Trumpower, Odie Sera, NP ?

## 2022-03-17 ENCOUNTER — Ambulatory Visit (INDEPENDENT_AMBULATORY_CARE_PROVIDER_SITE_OTHER): Payer: Medicaid Other | Admitting: Sports Medicine

## 2022-03-17 DIAGNOSIS — M5441 Lumbago with sciatica, right side: Secondary | ICD-10-CM | POA: Diagnosis not present

## 2022-03-17 DIAGNOSIS — M5442 Lumbago with sciatica, left side: Secondary | ICD-10-CM

## 2022-03-17 LAB — CERVICOVAGINAL ANCILLARY ONLY
Chlamydia: NEGATIVE
Comment: NEGATIVE
Comment: NORMAL
Neisseria Gonorrhea: NEGATIVE

## 2022-03-17 LAB — CULTURE, BETA STREP (GROUP B ONLY)
MICRO NUMBER:: 13265467
SPECIMEN QUALITY:: ADEQUATE

## 2022-03-17 LAB — OB RESULTS CONSOLE GBS: GBS: NEGATIVE

## 2022-03-17 NOTE — Assessment & Plan Note (Addendum)
This is a very pleasant 26 year old female, approximately 37 weeks, 2 months of back pain. ?Back pain is localized at the sacroiliac joints bilaterally. ?We discussed the anatomy and pathophysiology, we will start conservatively with acetaminophen, formal physical therapy, holding off on imaging and intervention. ?I like to see her back in about 4 to 6 weeks, if persistent discomfort we will consider sacroiliac joint injections. ?

## 2022-03-17 NOTE — Progress Notes (Signed)
? ? ?  Procedures performed today:   ? ?None. ? ?Independent interpretation of notes and tests performed by another provider:  ? ?None. ? ?Brief History, Exam, Impression, and Recommendations:   ? ?Acute bilateral low back pain with bilateral sciatica ?This is a very pleasant 26 year old female, approximately 37 weeks, 2 months of back pain. ?Back pain is localized at the sacroiliac joints bilaterally. ?We discussed the anatomy and pathophysiology, we will start conservatively with acetaminophen, formal physical therapy, holding off on imaging and intervention. ?I like to see her back in about 4 to 6 weeks, if persistent discomfort we will consider sacroiliac joint injections. ? ? ? ?___________________________________________ ?Gwen Her. Dianah Field, M.D., ABFM., CAQSM. ?Primary Care and Sports Medicine ?[place] ? ?Adjunct Instructor of Family Medicine  ?[employer] ?

## 2022-03-21 ENCOUNTER — Ambulatory Visit (INDEPENDENT_AMBULATORY_CARE_PROVIDER_SITE_OTHER): Payer: Medicaid Other | Admitting: Certified Nurse Midwife

## 2022-03-21 VITALS — BP 113/68 | HR 85 | Wt 134.0 lb

## 2022-03-21 DIAGNOSIS — Z3A37 37 weeks gestation of pregnancy: Secondary | ICD-10-CM

## 2022-03-21 DIAGNOSIS — Z8759 Personal history of other complications of pregnancy, childbirth and the puerperium: Secondary | ICD-10-CM | POA: Insufficient documentation

## 2022-03-21 DIAGNOSIS — Z3480 Encounter for supervision of other normal pregnancy, unspecified trimester: Secondary | ICD-10-CM

## 2022-03-21 NOTE — Progress Notes (Signed)
Subjective:  ?Kelly Allen is a 26 y.o. G2P1001 at [redacted]w[redacted]d being seen today for ongoing prenatal care.  She is currently monitored for the following issues for this low-risk pregnancy and has Supervision of other normal pregnancy, antepartum; Braxton Hicks contractions; Abnormal vaginal Pap smear; Acute bilateral low back pain with bilateral sciatica; and History of postpartum hemorrhage on their problem list. ? ?Patient reports  pelvic discomfort, has tried belt, tylenol, rest but not helping .  Contractions: Not present. Vag. Bleeding: None.  Movement: Present. Denies leaking of fluid.  ? ?The following portions of the patient's history were reviewed and updated as appropriate: allergies, current medications, past family history, past medical history, past social history, past surgical history and problem list. Problem list updated. ? ?Objective:  ? ?Vitals:  ? 03/21/22 1015  ?BP: 113/68  ?Pulse: 85  ?Weight: 134 lb (60.8 kg)  ? ? ?Fetal Status: Fetal Heart Rate (bpm): 141   Movement: Present    ? ?General:  Alert, oriented and cooperative. Patient is in no acute distress.  ?Skin: Skin is warm and dry. No rash noted.   ?Cardiovascular: Normal heart rate noted  ?Respiratory: Normal respiratory effort, no problems with respiration noted  ?Abdomen: Soft, gravid, appropriate for gestational age. Pain/Pressure: Present     ?Pelvic: Vag. Bleeding: None Vag D/C Character: Thin   ?Cervical exam deferred        ?Extremities: Normal range of motion.  Edema: Trace  ?Mental Status: Normal mood and affect. Normal behavior. Normal judgment and thought content.  ? ?Urinalysis:     ? ?Assessment and Plan:  ?Pregnancy: G2P1001 at [redacted]w[redacted]d ? ?1. Supervision of other normal pregnancy, antepartum ? ?2. History of postpartum hemorrhage ?-plan ppx ? ?3. [redacted] weeks gestation of pregnancy ?-comfort measures discussed ?-pt inquiring about BTL, discussed r/b/a and would need to sign papers; she will discuss with partner and meet with MD next  visit if wishes to persue BTL ? ?Term labor symptoms and general obstetric precautions including but not limited to vaginal bleeding, contractions, leaking of fluid and fetal movement were reviewed in detail with the patient. ?Please refer to After Visit Summary for other counseling recommendations.  ?Return in about 1 week (around 03/28/2022). ? ? ?Donette Larry, CNM ? ?

## 2022-03-22 ENCOUNTER — Other Ambulatory Visit: Payer: Self-pay

## 2022-03-22 ENCOUNTER — Inpatient Hospital Stay (HOSPITAL_COMMUNITY)
Admission: AD | Admit: 2022-03-22 | Discharge: 2022-03-23 | Disposition: A | Discharge status: Home / Self Care | Payer: Medicaid Other | Attending: Obstetrics and Gynecology | Admitting: Obstetrics and Gynecology

## 2022-03-22 ENCOUNTER — Encounter (HOSPITAL_COMMUNITY): Payer: Self-pay | Admitting: Obstetrics and Gynecology

## 2022-03-22 DIAGNOSIS — Z3689 Encounter for other specified antenatal screening: Secondary | ICD-10-CM | POA: Diagnosis not present

## 2022-03-22 DIAGNOSIS — O471 False labor at or after 37 completed weeks of gestation: Secondary | ICD-10-CM | POA: Diagnosis not present

## 2022-03-22 DIAGNOSIS — O479 False labor, unspecified: Secondary | ICD-10-CM

## 2022-03-22 NOTE — MAU Provider Note (Signed)
S: Patient is here for RN labor evaluation. Fetal tracing, vital signs, & chart reviewed  ? ?O:  ?Vitals:  ? 03/22/22 2106 03/22/22 2128  ?BP: 122/72 122/76  ?Pulse: (!) 109 100  ?Resp: 20 18  ?Temp:  98.8 ?F (37.1 ?C)  ?TempSrc: Oral Oral  ?SpO2: 96% 97%  ?Weight: 61.8 kg   ?Height: 5\' 1"  (1.549 m)   ? ? ?Dilation: 3 ?Effacement (%): 60 ?Cervical Position: Middle ?Station: -3 ?Presentation: Vertex ?Exam by:: J.Bellamy, RN ? ? ?FHR: 135 bpm, Mod Var, No Decels, 15 x 15 Accels ?UC: Irregular ? ? ?A: ?1. Braxton Hick's contraction   ?2. Non-stress test reactive on fetal surveillance   ?3. Cervix remains 3/50/-3 two hours after initial assessment ? ? ?P:  ?RN to discharge home in stable condition with return precautions & fetal kick counts ? ?Darlina Rumpf, CNM ?03/23/22 ?12:27 AM ? ?

## 2022-03-22 NOTE — MAU Note (Signed)
..  Kelly Allen is a 26 y.o. at [redacted]w[redacted]d here in MAU reporting: CTX 1 min and less for the last 40 mins. Pt states feeling in her back. Pt denies DFM, VB, LOF, abnormal discharge, recent intercourse, and complication in the pregnancy.  ?GBS neg ?No SVE ? ?Onset of complaint: 2020 ?Pain score: 9/10 ?Vitals:  ? 03/22/22 2106  ?BP: 122/72  ?Pulse: (!) 109  ?Resp: 20  ?SpO2: 96%  ?   ?FHT:177 ?Lab orders placed from triage:none ? ?

## 2022-03-23 DIAGNOSIS — O479 False labor, unspecified: Secondary | ICD-10-CM | POA: Diagnosis not present

## 2022-03-23 DIAGNOSIS — O471 False labor at or after 37 completed weeks of gestation: Secondary | ICD-10-CM | POA: Diagnosis not present

## 2022-03-28 ENCOUNTER — Inpatient Hospital Stay (HOSPITAL_COMMUNITY): Payer: Medicaid Other | Admitting: Anesthesiology

## 2022-03-28 ENCOUNTER — Inpatient Hospital Stay (HOSPITAL_COMMUNITY)
Admission: AD | Admit: 2022-03-28 | Discharge: 2022-03-30 | DRG: 807 | Disposition: A | Discharge status: Home / Self Care | Payer: Medicaid Other | Attending: Obstetrics & Gynecology | Admitting: Obstetrics & Gynecology

## 2022-03-28 ENCOUNTER — Encounter (HOSPITAL_COMMUNITY): Payer: Self-pay | Admitting: Obstetrics and Gynecology

## 2022-03-28 ENCOUNTER — Other Ambulatory Visit: Payer: Self-pay

## 2022-03-28 DIAGNOSIS — F17210 Nicotine dependence, cigarettes, uncomplicated: Secondary | ICD-10-CM | POA: Diagnosis present

## 2022-03-28 DIAGNOSIS — O99334 Smoking (tobacco) complicating childbirth: Secondary | ICD-10-CM | POA: Diagnosis present

## 2022-03-28 DIAGNOSIS — O26893 Other specified pregnancy related conditions, third trimester: Secondary | ICD-10-CM | POA: Diagnosis present

## 2022-03-28 DIAGNOSIS — Z8759 Personal history of other complications of pregnancy, childbirth and the puerperium: Secondary | ICD-10-CM

## 2022-03-28 DIAGNOSIS — O4292 Full-term premature rupture of membranes, unspecified as to length of time between rupture and onset of labor: Secondary | ICD-10-CM | POA: Diagnosis present

## 2022-03-28 DIAGNOSIS — R87610 Atypical squamous cells of undetermined significance on cytologic smear of cervix (ASC-US): Secondary | ICD-10-CM | POA: Diagnosis present

## 2022-03-28 DIAGNOSIS — Z3A38 38 weeks gestation of pregnancy: Secondary | ICD-10-CM

## 2022-03-28 DIAGNOSIS — Z3480 Encounter for supervision of other normal pregnancy, unspecified trimester: Secondary | ICD-10-CM

## 2022-03-28 DIAGNOSIS — R87629 Unspecified abnormal cytological findings in specimens from vagina: Principal | ICD-10-CM

## 2022-03-28 LAB — TYPE AND SCREEN
ABO/RH(D): O POS
Antibody Screen: NEGATIVE

## 2022-03-28 LAB — CBC
HCT: 34.9 % — ABNORMAL LOW (ref 36.0–46.0)
Hemoglobin: 12 g/dL (ref 12.0–15.0)
MCH: 31.1 pg (ref 26.0–34.0)
MCHC: 34.4 g/dL (ref 30.0–36.0)
MCV: 90.4 fL (ref 80.0–100.0)
Platelets: 251 10*3/uL (ref 150–400)
RBC: 3.86 MIL/uL — ABNORMAL LOW (ref 3.87–5.11)
RDW: 12.8 % (ref 11.5–15.5)
WBC: 13 10*3/uL — ABNORMAL HIGH (ref 4.0–10.5)
nRBC: 0 % (ref 0.0–0.2)

## 2022-03-28 LAB — POCT FERN TEST: POCT Fern Test: POSITIVE

## 2022-03-28 MED ORDER — EPHEDRINE 5 MG/ML INJ: 10.0000 mg | INTRAVENOUS | Status: DC | PRN

## 2022-03-28 MED ORDER — PHENYLEPHRINE 80 MCG/ML (10ML) SYRINGE FOR IV PUSH (FOR BLOOD PRESSURE SUPPORT)
80.0000 ug | PREFILLED_SYRINGE | INTRAVENOUS | Status: DC | PRN
  Filled 2022-03-28: qty 10

## 2022-03-28 MED ORDER — FENTANYL-BUPIVACAINE-NACL 0.5-0.125-0.9 MG/250ML-% EP SOLN
12.0000 mL/h | EPIDURAL | Status: DC | PRN
  Administered 2022-03-28: 12 mL/h via EPIDURAL
  Filled 2022-03-28: qty 250

## 2022-03-28 MED ORDER — LACTATED RINGERS IV SOLN
500.0000 mL | Freq: Once | INTRAVENOUS | Status: AC
  Administered 2022-03-28: 500 mL via INTRAVENOUS

## 2022-03-28 MED ORDER — LACTATED RINGERS IV SOLN: INTRAVENOUS | Status: DC

## 2022-03-28 MED ORDER — LACTATED RINGERS IV SOLN: 500.0000 mL | INTRAVENOUS | Status: DC | PRN

## 2022-03-28 MED ORDER — PHENYLEPHRINE 80 MCG/ML (10ML) SYRINGE FOR IV PUSH (FOR BLOOD PRESSURE SUPPORT): 80.0000 ug | PREFILLED_SYRINGE | INTRAVENOUS | Status: DC | PRN

## 2022-03-28 MED ORDER — FENTANYL-BUPIVACAINE-NACL 0.5-0.125-0.9 MG/250ML-% EP SOLN: 12.0000 mL/h | EPIDURAL | Status: DC | PRN

## 2022-03-28 MED ORDER — DIPHENHYDRAMINE HCL 50 MG/ML IJ SOLN: 12.5000 mg | INTRAMUSCULAR | Status: DC | PRN

## 2022-03-28 MED ORDER — TERBUTALINE SULFATE 1 MG/ML IJ SOLN: 0.2500 mg | Freq: Once | INTRAMUSCULAR | Status: DC | PRN

## 2022-03-28 MED ORDER — OXYCODONE-ACETAMINOPHEN 5-325 MG PO TABS: 1.0000 | ORAL_TABLET | ORAL | Status: DC | PRN

## 2022-03-28 MED ORDER — OXYTOCIN BOLUS FROM INFUSION
333.0000 mL | Freq: Once | INTRAVENOUS | Status: AC
  Administered 2022-03-29: 333 mL via INTRAVENOUS

## 2022-03-28 MED ORDER — ONDANSETRON HCL 4 MG/2ML IJ SOLN
4.0000 mg | Freq: Four times a day (QID) | INTRAMUSCULAR | Status: DC | PRN
  Administered 2022-03-28: 4 mg via INTRAVENOUS
  Filled 2022-03-28: qty 2

## 2022-03-28 MED ORDER — OXYCODONE-ACETAMINOPHEN 5-325 MG PO TABS: 2.0000 | ORAL_TABLET | ORAL | Status: DC | PRN

## 2022-03-28 MED ORDER — LIDOCAINE HCL (PF) 1 % IJ SOLN: 30.0000 mL | INTRAMUSCULAR | Status: DC | PRN

## 2022-03-28 MED ORDER — TRANEXAMIC ACID-NACL 1000-0.7 MG/100ML-% IV SOLN
1000.0000 mg | Freq: Once | INTRAVENOUS | Status: AC
Start: 2022-03-28 — End: 2022-03-29
  Administered 2022-03-29: 1000 mg via INTRAVENOUS
  Filled 2022-03-28: qty 100

## 2022-03-28 MED ORDER — LIDOCAINE HCL (PF) 1 % IJ SOLN
INTRAMUSCULAR | Status: DC | PRN
  Administered 2022-03-28: 8 mL via EPIDURAL

## 2022-03-28 MED ORDER — SOD CITRATE-CITRIC ACID 500-334 MG/5ML PO SOLN: 30.0000 mL | ORAL | Status: DC | PRN

## 2022-03-28 MED ORDER — OXYTOCIN-SODIUM CHLORIDE 30-0.9 UT/500ML-% IV SOLN
1.0000 m[IU]/min | INTRAVENOUS | Status: DC
  Administered 2022-03-28: 2 m[IU]/min via INTRAVENOUS

## 2022-03-28 MED ORDER — ACETAMINOPHEN 325 MG PO TABS
650.0000 mg | ORAL_TABLET | ORAL | Status: DC | PRN
  Administered 2022-03-29: 650 mg via ORAL
  Filled 2022-03-28: qty 2

## 2022-03-28 MED ORDER — OXYTOCIN-SODIUM CHLORIDE 30-0.9 UT/500ML-% IV SOLN
2.5000 [IU]/h | INTRAVENOUS | Status: DC
  Filled 2022-03-28: qty 500

## 2022-03-28 NOTE — Progress Notes (Signed)
Patient ID: De Nurse, female   DOB: Nov 16, 1996, 26 y.o.   MRN: [id number] ? ?Called to room by RN due to prolonged decel. Patient stated she does not feel her contractions, pain managed by epidural. She consents to sterile vag exam. Patient position change to left lateral. ? ?BP 114/60, P 64 ?FHR 115, moderate variability, +acel, +decel (prolonged) ?Ctx:1-2 minutes apart ?Cx: 8/90/0 ? ?IUP 38.4wks ?Category II ? ?-Sterile vag exam, responded to fetal stimuli. Fetal recovered to baseline, pt educated on notifying if she is feeling pressure or urge to push ?-Pain management: Epidural ?-Anticipate vaginal delivery ? ?Pleas Koch, SNM ?03/28/22, 2222 ? ? ? ? ?

## 2022-03-28 NOTE — MAU Note (Signed)
...  Kelly Allen is a 26 y.o. at [redacted]w[redacted]d here in MAU reporting: CTX for the past 20 minutes and gush of fluids that occurred at 1400 today. She reports her CTX are currently 6-7 minutes apart. +FM. No VB. ? ?Hx of PPH with first delivery. GBS-. ? ?Pain score: 8/10 lower abdomen ? ?FHT: 150 external ? ?

## 2022-03-28 NOTE — Anesthesia Procedure Notes (Signed)
Epidural ?Patient location during procedure: OB ?Start time: 03/28/2022 5:20 PM ?End time: 03/28/2022 5:25 PM ? ?Staffing ?Anesthesiologist: Janeece Riggers, MD ? ?Preanesthetic Checklist ?Completed: patient identified, IV checked, site marked, risks and benefits discussed, surgical consent, monitors and equipment checked, pre-op evaluation and timeout performed ? ?Epidural ?Patient position: sitting ?Prep: DuraPrep and site prepped and draped ?Patient monitoring: continuous pulse ox and blood pressure ?Approach: midline ?Location: L3-L4 ?Injection technique: LOR air ? ?Needle:  ?Needle type: Tuohy  ?Needle gauge: 17 G ?Needle length: 9 cm and 9 ?Needle insertion depth: 5 cm ?Catheter type: closed end flexible ?Catheter size: 19 Gauge ?Catheter at skin depth: 10 cm ?Test dose: negative ? ?Assessment ?Events: blood not aspirated, injection not painful, no injection resistance, no paresthesia and negative IV test ? ? ? ?

## 2022-03-28 NOTE — H&P (Signed)
OBSTETRIC ADMISSION HISTORY AND PHYSICAL ? ?Kelly Allen is a 26 y.o. female G2P1001 with IUP at [redacted]w[redacted]d by 10 week [place] presenting for SROM at 1400 today and contractions. She reports that the fluid was clear in color. She has been having painful contractions since then. She reports +FMs, no VB, no blurry vision, headaches, peripheral edema, or RUQ pain.  She plans on breast feeding.    ? ?She received her prenatal care at [facility].  ? ?Dating: By [place] --->  Estimated Date of Delivery: 04/07/22 ? ?Sono:   ?@[redacted]w[redacted]d , normal anatomy, cephalic presentation,  posterior placental lie, 254 g, 30% EFW ? ?Prenatal History/Complications:  ?Hx of PPH in prior pregnancy  ?ASCUS of cervix with negative HR HPV ?Bilateral low back pain with sciatica  ? ?Past Medical History: ?Past Medical History:  ?Diagnosis Date  ? Acid reflux   ? IBS (irritable bowel syndrome)   ? ? ?Past Surgical History: ?Past Surgical History:  ?Procedure Laterality Date  ? CHOLECYSTECTOMY    ? WISDOM TOOTH EXTRACTION    ? ? ?Obstetrical History: ?OB History   ? ? Gravida  ?2  ? Para  ?1  ? Term  ?1  ? Preterm  ?   ? AB  ?   ? Living  ?1  ?  ? ? SAB  ?   ? IAB  ?   ? Ectopic  ?   ? Multiple  ?   ? Live Births  ?1  ?   ?  ?  ? ? ?Social History ?Social History  ? ?Socioeconomic History  ? Marital status: Single  ?  Spouse name: Not on file  ? Number of children: Not on file  ? Years of education: Not on file  ? Highest education level: Not on file  ?Occupational History  ? Occupation: unemployed  ?Tobacco Use  ? Smoking status: Every Day  ?  Packs/day: 0.25  ?  Years: 7.00  ?  Pack years: 1.75  ?  Types: Cigarettes  ? Smokeless tobacco: Never  ?Vaping Use  ? Vaping Use: Former  ? Substances: Nicotine  ?Substance and Sexual Activity  ? Alcohol use: Not Currently  ? Drug use: Not Currently  ? Sexual activity: Yes  ?  Partners: Male  ?  Birth control/protection: None  ?Other Topics Concern  ? Not on file  ?Social History Narrative  ? Not on file  ? ?Social  Determinants of Health  ? ?Financial Resource Strain: Not on file  ?Food Insecurity: Not on file  ?Transportation Needs: Not on file  ?Physical Activity: Not on file  ?Stress: Not on file  ?Social Connections: Not on file  ? ? ?Family History: ?Family History  ?Problem Relation Age of Onset  ? Diabetes Mother   ? Asthma Father   ? COPD Father   ? Cancer Paternal Grandmother   ?     stomach  ? ? ?Allergies: ?No Known Allergies ? ?Pt denies allergies to latex, iodine, or shellfish. ? ?Medications Prior to Admission  ?Medication Sig Dispense Refill Last Dose  ? cyclobenzaprine (FLEXERIL) 10 MG tablet Take 1 tablet (10 mg total) by mouth every 8 (eight) hours as needed for muscle spasms. 30 tablet 1 03/28/2022  ? famotidine (PEPCID) 20 MG tablet Take 1 tablet (20 mg total) by mouth 2 (two) times daily. 60 tablet 3 03/28/2022  ? ondansetron (ZOFRAN ODT) 8 MG disintegrating tablet Take 1 tablet (8 mg total) by mouth every 8 (eight) hours as  needed for nausea or vomiting. 20 tablet 3 03/27/2022  ? Prenatal Vit-Fe Fumarate-FA (PRENATAL VITAMIN PO) Take by mouth.   03/27/2022  ? ? ? ?Review of Systems  ?All systems reviewed and negative except as stated in HPI. ? ?Blood pressure 121/78, pulse (!) 118, temperature 98.4 ?F (36.9 ?C), temperature source Oral, resp. rate 19, height 5' (1.524 m), weight 60.8 kg, last menstrual period 07/06/2021, SpO2 100 %, currently breastfeeding. ? ?General appearance: alert, cooperative, and no distress ?Lungs: normal work of breathing on room air  ?Heart: normal rate, warm and well perfused  ?Abdomen: soft, non-tender, gravid  ?Extremities: no LE edema or calf tenderness to palpation  ? ?Presentation:  Cephalic per RN ?Fetal monitoring: Baseline 135 bpm, moderate variability, + accels, no decels  ?Uterine activity: Every 2-6 minutes  ?Dilation: 3 ?Effacement (%): 80 ?Station: -1 ?Exam by:: Imagene Sheller RN ? ?Prenatal labs: ?ABO, Rh: O/RH(D) POSITIVE/-- (10/11 1456) ?Antibody: NO ANTIBODIES  DETECTED (10/11 1456) ?Rubella: 5.12 (10/11 1456) ?RPR: NON-REACTIVE (02/28 5621)  ?HBsAg: NON-REACTIVE (10/11 1456)  ?HIV: NON-REACTIVE (02/28 3086)  ?GBS: Negative  ?2 hr Glucola: Normal  ?Genetic screening: LR NIPS, Horizon negative  ?Anatomy US: Normal  ? ?Prenatal Transfer Tool  ?Maternal Diabetes: No ?Genetic Screening: Normal ?Maternal Ultrasounds/Referrals: Normal ?Fetal Ultrasounds or other Referrals:  None ?Maternal Substance Abuse:  No ?Significant Maternal Medications:  None ?Significant Maternal Lab Results: Group B Strep negative ? ?Results for orders placed or performed during the hospital encounter of 03/28/22 (from the past 24 hour(s))  ?Fern Test  ? Collection Time: 03/28/22  4:06 PM  ?Result Value Ref Range  ? POCT Fern Test Positive = ruptured amniotic membanes   ? ? ?Patient Active Problem List  ? Diagnosis Date Noted  ? History of postpartum hemorrhage 03/21/2022  ? Acute bilateral low back pain with bilateral sciatica 02/28/2022  ? Abnormal vaginal Pap smear 01/20/2022  ? Braxton Hicks contractions 01/14/2022  ? Supervision of other normal pregnancy, antepartum 09/09/2021  ? ? ?Assessment/Plan:  ?Kelly Allen is a 26 y.o. G2P1001 at [redacted]w[redacted]d here for SROM and contractions.  ? ?#Labor: Contracting painfully. Will expectantly manage for now and reassess 3-4 hours from last exam. Plan to augment with Pitocin if contractions space or if cervix is unchanged on next check  ?#Pain: Epidural  ?#FWB: Cat 1  ?#ID:  GBS neg ?#MOF: Breast  ?#MOC: None  ? ?#Hx of PPH: Does not remember interventions performed but partner voiced remembering a balloon being placed to help with bleeding. Does not recall needing a blood transfusion. Starting Hgb 12.0 on admission. Plan for TXA at delivery.  ? ?Evalina Field, MD  ?[facility] Fellow  Faculty Practice  ? ?

## 2022-03-28 NOTE — Anesthesia Preprocedure Evaluation (Signed)
Anesthesia Evaluation  ?Patient identified by MRN, date of birth, ID band ?Patient awake ? ? ? ?Reviewed: ?Allergy & Precautions, H&P , NPO status , Patient's Chart, lab work & pertinent test results, reviewed documented beta blocker date and time  ? ?Airway ?Mallampati: I ? ?TM Distance: >3 FB ?Neck ROM: full ? ? ? Dental ?no notable dental hx. ?(+) Teeth Intact, Dental Advisory Given ?  ?Pulmonary ?neg pulmonary ROS, Current Smoker,  ?  ?Pulmonary exam normal ?breath sounds clear to auscultation ? ? ? ? ? ? Cardiovascular ?negative cardio ROS ?Normal cardiovascular exam ?Rhythm:regular Rate:Normal ? ? ?  ?Neuro/Psych ?negative neurological ROS ? negative psych ROS  ? GI/Hepatic ?negative GI ROS, Neg liver ROS,   ?Endo/Other  ?negative endocrine ROS ? Renal/GU ?negative Renal ROS  ?negative genitourinary ?  ?Musculoskeletal ? ? Abdominal ?  ?Peds ? Hematology ?negative hematology ROS ?(+)   ?Anesthesia Other Findings ? ? Reproductive/Obstetrics ?(+) Pregnancy ? ?  ? ? ? ? ? ? ? ? ? ? ? ? ? ?  ?  ? ? ? ? ? ? ? ? ?Anesthesia Physical ?Anesthesia Plan ? ?ASA: 2 ? ?Anesthesia Plan: Epidural  ? ?Post-op Pain Management: Minimal or no pain anticipated  ? ?Induction:  ? ?PONV Risk Score and Plan: 1 and Treatment may vary due to age or medical condition ? ?Airway Management Planned: Natural Airway ? ?Additional Equipment: None ? ?Intra-op Plan:  ? ?Post-operative Plan:  ? ?Informed Consent: I have reviewed the patients History and Physical, chart, labs and discussed the procedure including the risks, benefits and alternatives for the proposed anesthesia with the patient or authorized representative who has indicated his/her understanding and acceptance.  ? ? ? ? ? ?Plan Discussed with: Anesthesiologist and CRNA ? ?Anesthesia Plan Comments:   ? ? ? ? ? ? ?Anesthesia Quick Evaluation ? ?

## 2022-03-28 NOTE — Progress Notes (Signed)
Kelly Allen is a 26 y.o. G2P1001 at [redacted]w[redacted]d admitted for PROM ? ?Subjective: ?comfortable with epidural, states she does not feel her contractions. She denies any needs and consents to sterile vag exam and possible IUPC placement. ? ?Objective: ?BP 114/60   Pulse 64   Temp 98.2 ?F (36.8 ?C) (Oral)   Resp 18   Ht 5' (1.524 m)   Wt 60.8 kg   LMP 07/06/2021 (Approximate)   SpO2 96%   BMI 26.18 kg/m?  ?No intake/output data recorded. ? ?FHR baseline 125 bpm, Variability: moderate, Accelerations:present, Decelerations:  Absent ?Toco: regular, every 1-2 minutes  ? ?SVE:   Dilation: 8 ?Effacement (%): 90 ?Station: 0 ?Exam by:: Jamse Mead Student CNM ? ? ? ?Labs: ?Lab Results  ?Component Value Date  ? WBC 13.0 (H) 03/28/2022  ? HGB 12.0 03/28/2022  ? HCT 34.9 (L) 03/28/2022  ? MCV 90.4 03/28/2022  ? PLT 251 03/28/2022  ? ? ?Assessment / Plan: ?Expectant management. Hold Pitocin. Adequate ctx, IUPC not inserted. ? ?Labor: active ?Fetal Wellbeing:  Category I ?Pain Control:  epidural ?Pre-eclampsia: N/A ?I/D:  GBS neg ?Anticipated MOD: hopeful for vaginal birth ? ?Karl Pock, SNM ?03/28/2022, 9:56 PM  ?

## 2022-03-28 NOTE — Progress Notes (Signed)
Labor Progress Note ?Satoria JAZELYN SIPE is a 26 y.o. G2P1001 at [redacted]w[redacted]d presented for SROM ?S: Patient is resting comfortably. Endorses contraction and pain tolerated. No complains at this time. ? ?O:  ?BP 100/62   Pulse 84   Temp 98.2 ?F (36.8 ?C) (Oral)   Resp 18   Ht 5' (1.524 m)   Wt 60.8 kg   LMP 07/06/2021 (Approximate)   SpO2 96%   BMI 26.18 kg/m?  ?EFM: 130 bpm / moderate variability /+accels, no decels ? ?CVE: Dilation: 4 ?Effacement (%): 80 ?Station: -1 ?Presentation: Vertex ?Exam by:: Dr Mathis Fare ? ? ?A&P: 26 y.o. G2P1001 [redacted]w[redacted]d  ?#Labor: Progressing well. Cervix is 4cm dilated. Contractions are spaced out, will start pitocin and patient is agreeable to plan. Continue induction management with plan to reassess in 3-4 hours. ?#Pain: Epidural ?#FWB: CAT 1 ?#GBS negative ? ?Jerre Simon, MD ?Center for [facility], [facility] ?7:52 PM  ?

## 2022-03-29 ENCOUNTER — Encounter (HOSPITAL_COMMUNITY): Payer: Self-pay | Admitting: Family Medicine

## 2022-03-29 DIAGNOSIS — Z3A38 38 weeks gestation of pregnancy: Secondary | ICD-10-CM | POA: Diagnosis not present

## 2022-03-29 LAB — RPR: RPR Ser Ql: NONREACTIVE

## 2022-03-29 MED ORDER — ONDANSETRON HCL 4 MG/2ML IJ SOLN: 4.0000 mg | INTRAMUSCULAR | Status: DC | PRN

## 2022-03-29 MED ORDER — ACETAMINOPHEN 325 MG PO TABS
650.0000 mg | ORAL_TABLET | ORAL | Status: DC | PRN
  Administered 2022-03-29 – 2022-03-30 (×3): 650 mg via ORAL
  Filled 2022-03-29 (×4): qty 2

## 2022-03-29 MED ORDER — COCONUT OIL OIL: 1.0000 "application " | TOPICAL_OIL | Status: DC | PRN

## 2022-03-29 MED ORDER — SIMETHICONE 80 MG PO CHEW: 80.0000 mg | CHEWABLE_TABLET | ORAL | Status: DC | PRN

## 2022-03-29 MED ORDER — DIPHENHYDRAMINE HCL 25 MG PO CAPS: 25.0000 mg | ORAL_CAPSULE | Freq: Four times a day (QID) | ORAL | Status: DC | PRN

## 2022-03-29 MED ORDER — OXYCODONE HCL 5 MG PO TABS
5.0000 mg | ORAL_TABLET | ORAL | Status: DC | PRN
  Administered 2022-03-29 – 2022-03-30 (×3): 5 mg via ORAL
  Filled 2022-03-29 (×3): qty 1

## 2022-03-29 MED ORDER — SODIUM CHLORIDE 0.9% FLUSH
3.0000 mL | Freq: Two times a day (BID) | INTRAVENOUS | Status: DC
  Administered 2022-03-29: 3 mL via INTRAVENOUS

## 2022-03-29 MED ORDER — METHYLERGONOVINE MALEATE 0.2 MG/ML IJ SOLN
INTRAMUSCULAR | Status: AC
  Filled 2022-03-29: qty 1

## 2022-03-29 MED ORDER — OXYCODONE HCL 5 MG PO TABS: 10.0000 mg | ORAL_TABLET | ORAL | Status: DC | PRN

## 2022-03-29 MED ORDER — MEASLES, MUMPS & RUBELLA VAC IJ SOLR: 0.5000 mL | Freq: Once | INTRAMUSCULAR | Status: DC

## 2022-03-29 MED ORDER — SENNOSIDES-DOCUSATE SODIUM 8.6-50 MG PO TABS
2.0000 | ORAL_TABLET | ORAL | Status: DC
  Administered 2022-03-29: 2 via ORAL
  Filled 2022-03-29: qty 2

## 2022-03-29 MED ORDER — METHYLERGONOVINE MALEATE 0.2 MG/ML IJ SOLN
0.2000 mg | Freq: Once | INTRAMUSCULAR | Status: AC
  Administered 2022-03-29: 0.2 mg via INTRAMUSCULAR

## 2022-03-29 MED ORDER — ZOLPIDEM TARTRATE 5 MG PO TABS: 5.0000 mg | ORAL_TABLET | Freq: Every evening | ORAL | Status: DC | PRN

## 2022-03-29 MED ORDER — TETANUS-DIPHTH-ACELL PERTUSSIS 5-2.5-18.5 LF-MCG/0.5 IM SUSY: 0.5000 mL | PREFILLED_SYRINGE | Freq: Once | INTRAMUSCULAR | Status: DC

## 2022-03-29 MED ORDER — IBUPROFEN 600 MG PO TABS
600.0000 mg | ORAL_TABLET | Freq: Four times a day (QID) | ORAL | Status: DC
  Administered 2022-03-29 – 2022-03-30 (×3): 600 mg via ORAL
  Filled 2022-03-29 (×5): qty 1

## 2022-03-29 MED ORDER — BENZOCAINE-MENTHOL 20-0.5 % EX AERO: 1.0000 "application " | INHALATION_SPRAY | CUTANEOUS | Status: DC | PRN

## 2022-03-29 MED ORDER — PRENATAL MULTIVITAMIN CH
1.0000 | ORAL_TABLET | Freq: Every day | ORAL | Status: DC
  Administered 2022-03-29: 1 via ORAL
  Filled 2022-03-29: qty 1

## 2022-03-29 MED ORDER — SODIUM CHLORIDE 0.9% FLUSH: 3.0000 mL | INTRAVENOUS | Status: DC | PRN

## 2022-03-29 MED ORDER — SODIUM CHLORIDE 0.9 % IV SOLN: 250.0000 mL | INTRAVENOUS | Status: DC | PRN

## 2022-03-29 MED ORDER — MEDROXYPROGESTERONE ACETATE 150 MG/ML IM SUSP: 150.0000 mg | INTRAMUSCULAR | Status: DC | PRN

## 2022-03-29 MED ORDER — WITCH HAZEL-GLYCERIN EX PADS: 1.0000 "application " | MEDICATED_PAD | CUTANEOUS | Status: DC | PRN

## 2022-03-29 MED ORDER — DIBUCAINE (PERIANAL) 1 % EX OINT: 1.0000 "application " | TOPICAL_OINTMENT | CUTANEOUS | Status: DC | PRN

## 2022-03-29 MED ORDER — ONDANSETRON HCL 4 MG PO TABS
4.0000 mg | ORAL_TABLET | ORAL | Status: DC | PRN
  Administered 2022-03-29: 4 mg via ORAL
  Filled 2022-03-29: qty 1

## 2022-03-29 MED ORDER — IBUPROFEN 600 MG PO TABS: 600.0000 mg | ORAL_TABLET | Freq: Four times a day (QID) | ORAL | Status: DC

## 2022-03-29 NOTE — Discharge Summary (Signed)
? ?  Postpartum Discharge Summary ?  ?Patient Name: Kelly Allen ?DOB: May 19, 1996 ?MRN: [id number] ? ?Date of admission: 03/28/2022 ?Delivery date:03/29/2022  ?Delivering provider: Patriciaann Clan  ?Date of discharge: 03/30/2022 ? ?Admitting diagnosis: Normal labor [O80, Z37.9] ?Intrauterine pregnancy: [redacted]w[redacted]d    ?Secondary diagnosis:  Principal Problem: ?  Normal labor ?Active Problems: ?  Supervision of other normal pregnancy, antepartum ?  ASCUS of cervix with negative high risk HPV ?  History of postpartum hemorrhage ? ?Additional problems: n/a    ?Discharge diagnosis: Term Pregnancy Delivered                                              ?Post partum procedures: n\/a ?Augmentation: Pitocin ?Complications: None ? ?Hospital course: Onset of Labor With Vaginal Delivery      ?26y.o. yo G2P2002 at 369w5das admitted in Active Labor on 03/28/2022. Patient had an uncomplicated labor course as follows:  ?Membrane Rupture Time/Date: 2:00 PM ,03/28/2022   ?Delivery Method:Vaginal, Spontaneous  ?Episiotomy: None  ?Lacerations:  1st degree;Periurethral  ?Patient had an uncomplicated postpartum course.  She is ambulating, tolerating a regular diet, passing flatus, and urinating well. Patient is discharged home in stable condition on 03/30/22. ? ?Newborn Data: ?Birth date:03/29/2022  ?Birth time:12:17 AM  ?Gender:Female  ?Living status:Living  ?Apgars:8 ,9  ?Weight:3390 g  ? ?Magnesium Sulfate received: No ?BMZ received: No ?Rhophylac:No ?MMR:No ?T-DaP:Given prenatally ?Flu: No ?Transfusion:No ? ?Physical exam  ?Vitals:  ? 03/29/22 1235 03/29/22 1654 03/29/22 2053 03/30/22 0510  ?BP: 105/74 99/66 109/74 108/64  ?Pulse: 84 82 61 61  ?Resp: 17 17 18 18   ?Temp: 98 ?F (36.7 ?C) 98.3 ?F (36.8 ?C) 97.9 ?F (36.6 ?C) 97.9 ?F (36.6 ?C)  ?TempSrc: Oral Oral Oral Oral  ?SpO2: 99% 99% 100% 100%  ?Weight:      ?Height:      ? ?General: alert, cooperative, and no distress ?Lochia: appropriate ?Uterine Fundus: firm ?Incision: N/A ?DVT  Evaluation: No evidence of DVT seen on physical exam. ?Labs: ?Lab Results  ?Component Value Date  ? WBC 13.0 (H) 03/28/2022  ? HGB 12.0 03/28/2022  ? HCT 34.9 (L) 03/28/2022  ? MCV 90.4 03/28/2022  ? PLT 251 03/28/2022  ? ? ?  Latest Ref Rng & Units 11/12/2021  ?  2:53 PM  ?CMP  ?Glucose 65 - 99 mg/dL 75    ?BUN 7 - 25 mg/dL 6    ?Creatinine 0.50 - 0.96 mg/dL 0.51    ?Sodium 135 - 146 mmol/L 138    ?Potassium 3.5 - 5.3 mmol/L 3.7    ?Chloride 98 - 110 mmol/L 105    ?CO2 20 - 32 mmol/L 23    ?Calcium 8.6 - 10.2 mg/dL 8.6    ?Total Protein 6.1 - 8.1 g/dL 6.4    ?Total Bilirubin 0.2 - 1.2 mg/dL 0.5    ?AST 10 - 30 U/L 9    ?ALT 6 - 29 U/L 6    ? ?Edinburgh Score: ? ?  03/29/2022  ?  4:54 PM  ?EdFlavia Shipperostnatal Depression Scale Screening Tool  ?I have been able to laugh and see the funny side of things. 0  ?I have looked forward with enjoyment to things. 0  ?I have blamed myself unnecessarily when things went wrong. 1  ?I have been anxious or worried for no good reason. 2  ?  I have felt scared or panicky for no good reason. 0  ?Things have been getting on top of me. 1  ?I have been so unhappy that I have had difficulty sleeping. 1  ?I have felt sad or miserable. 0  ?I have been so unhappy that I have been crying. 1  ?The thought of harming myself has occurred to me. 0  ?Edinburgh Postnatal Depression Scale Total 6  ? ? ? ?After visit meds:  ?Allergies as of 03/30/2022   ?No Known Allergies ?  ? ?  ?Medication List  ?  ? ?TAKE these medications   ? ?cyclobenzaprine 10 MG tablet ?Commonly known as: FLEXERIL ?Take 1 tablet (10 mg total) by mouth every 8 (eight) hours as needed for muscle spasms. ?  ?famotidine 20 MG tablet ?Commonly known as: Pepcid ?Take 1 tablet (20 mg total) by mouth 2 (two) times daily. ?  ?ibuprofen 600 MG tablet ?Commonly known as: ADVIL ?Take 1 tablet (600 mg total) by mouth every 6 (six) hours. ?  ?ondansetron 8 MG disintegrating tablet ?Commonly known as: Zofran ODT ?Take 1 tablet (8 mg total) by  mouth every 8 (eight) hours as needed for nausea or vomiting. ?  ?PRENATAL VITAMIN PO ?Take by mouth. ?  ? ?  ? ? ? ?Discharge home in stable condition ?Infant Feeding: Breast ?Infant Disposition:home with mother ?Discharge instruction: per After Visit Summary and Postpartum booklet. ?Activity: Advance as tolerated. Pelvic rest for 6 weeks.  ?Diet: routine diet ?Future Appointments: ?Future Appointments  ?Date Time Provider [facility]  ?03/31/2022  8:30 AM Gala Romney, Fredderick Phenix, MD [facility]  ?04/14/2022  1:45 PM Silverio Decamp, MD [facility] None  ? ?Follow up Visit: ? Follow-up Information   ? ? Center for [employer] [facility] [place] Follow up.   ?Specialty: Obstetrics and Gynecology ?Why: In 4 weeks for a postpartum appt ?Contact information: ?[address], [address] ?[place] ?[phone] ? ?  ?  ? ?  ?  ? ?  ? ? ?Message to [facility] by Dr Higinio Plan:  ?Please schedule this patient for a In person postpartum visit in 6 weeks with the following provider: Any provider. ?Additional Postpartum F/U: None    ?Low risk pregnancy complicated by:  None ?Delivery mode:  Vaginal, Spontaneous  ?Anticipated Birth Control:  Unsure ? ? ?03/30/2022 ?Wende Mott, CNM ? ? ? ?

## 2022-03-29 NOTE — Lactation Note (Signed)
This note was copied from a baby's chart. ?Lactation Consultation Note ? ?Patient Name: Kelly Allen ?Today's Date: 03/29/2022 ?Reason for consult: Initial assessment;Early term 37-38.6wks ?Age:24 hours ? ? ?P2 mother whose infant is now 11 hours old.  Mother's current feeding preference is breast.  She breast fed her first child (now 34-1/2 years old) for 2 years. ? ?Baby "Violet" was asleep on mother's chest when I arrived.  Mother had no questions/concerns related to breast feeding.  Last LATCH score was a 10.  Reviewed breast feeding basics with mother.  Encouraged feeding 8-12 times/24 hours or sooner if baby shows cues.  Encouraged to call her RN/LC for latch assistance as needed. ? ?No support person present at this time. ? ? ?Maternal Data ?Has patient been taught Hand Expression?: Yes ?Does the patient have breastfeeding experience prior to this delivery?: Yes ?How long did the patient breastfeed?: 2 years with her first child ? ?Feeding ?Mother's Current Feeding Choice: Breast Milk ? ?LATCH Score ?  ? ?  ? ?  ? ?  ? ?  ? ?  ? ? ?Lactation Tools Discussed/Used ?  ? ?Interventions ?Interventions: Breast feeding basics reviewed;Education;LC Services brochure ? ?Discharge ?Pump: Personal ? ?Consult Status ?Consult Status: Follow-up ?Date: 03/30/22 ?Follow-up type: In-patient ? ? ? ?Katonya Blecher R Edra Riccardi ?03/29/2022, 10:44 AM ? ? ? ?

## 2022-03-29 NOTE — Anesthesia Postprocedure Evaluation (Signed)
**Note Kelly-Identified via Obfuscation** Anesthesia Post Note ? ?Patient: Kelly Allen ? ?Procedure(s) Performed: AN AD HOC LABOR EPIDURAL ? ?  ? ?Patient location during evaluation: Mother Baby ?Anesthesia Type: Epidural ?Level of consciousness: awake and alert and oriented ?Pain management: satisfactory to patient ?Vital Signs Assessment: post-procedure vital signs reviewed and stable ?Respiratory status: respiratory function stable ?Cardiovascular status: stable ?Postop Assessment: no headache, no backache, epidural receding, patient able to bend at knees, no signs of nausea or vomiting, adequate PO intake and able to ambulate ?Anesthetic complications: no ? ? ?No notable events documented. ? ?Last Vitals:  ?Vitals:  ? 03/29/22 0418 03/29/22 0857  ?BP: 118/74 126/83  ?Pulse: 88 76  ?Resp: 17 17  ?Temp: 36.9 ?C 36.7 ?C  ?SpO2: 99% 99%  ?  ?Last Pain:  ?Vitals:  ? 03/29/22 0857  ?TempSrc: Oral  ?PainSc: 6   ? ?Pain Goal:   ? ?  ?  ?  ?  ?  ?  ?  ? ?Kelly Allen ? ? ? ? ?

## 2022-03-30 MED ORDER — IBUPROFEN 600 MG PO TABS: 600.0000 mg | ORAL_TABLET | Freq: Four times a day (QID) | ORAL | 0 refills | Status: DC

## 2022-03-30 NOTE — Lactation Note (Addendum)
This note was copied from a baby's chart. ?Lactation Consultation Note ? ?Patient Name: Kelly Allen ?Today's Date: 03/30/2022 ?Reason for consult: Follow-up assessment;Early term 37-38.6wks ?Age:26 years ? ?LC in to room prior to discharge. Baby is sleeping upon arrival. Mother reports good feedings a bit of nipple soreness. Mother is aware of using coconut oil for soreness. Discussed normal behavior and patterns after 24h, voids and stools as signs good intake, pumping, clusterfeeding, skin to skin. Talked about milk coming into volume and managing engorgement.  ? ?Plan: ?1-Aim for a deep, comfortable latch, breastfeeding on demand or 8-12 times in 24h period. ?2-Hand express/pump as needed for supplementation ?3-Encouraged maternal rest, hydration and food intake.  ? ?Contact LC as needed for feeds/support/concerns/questions. All questions answered at this time. Reviewed [place] brochure.   ? ? ?Maternal Data ?Does the patient have breastfeeding experience prior to this delivery?: Yes ?How long did the patient breastfeed?: 2 years ? ?Feeding ?Mother's Current Feeding Choice: Breast Milk ? ?Interventions ?Interventions: Breast feeding basics reviewed;Skin to skin;Expressed milk;Education;LC Services brochure;Coconut oil ? ?Discharge ?Discharge Education: Engorgement and breast care;Warning signs for feeding baby ?Pump: Personal ?[place] Program: Yes ? ?Consult Status ?Consult Status: Complete ?Date: 03/30/22 ?Follow-up type: Call as needed ? ? ? ?Ellyse Rotolo A Higuera Ancidey ?03/30/2022, 11:02 AM ? ? ? ?

## 2022-03-30 NOTE — Clinical Social Work Maternal (Signed)
?CLINICAL SOCIAL WORK MATERNAL/CHILD NOTE ? ?Patient Details  ?Name: Kelly Allen ?MRN: [id number] ?Date of Birth: 05/13/1996 ? ?Date:  03/30/2022 ? ?Clinical Social Worker Initiating Note:  Darra Lis, LCSW Date/Time: Initiated:  03/30/22/0945    ? ?Child's Name:  Kelly Allen  ? ?Biological Parents:  Mother, Father Kelly Allen 08/12/2020)  ? ?Need for Interpreter:  None  ? ?Reason for Referral:  Current Substance Use/Substance Use During Pregnancy    ? ?Address:  [address] ?[place] [address]  ?  ?Phone number:  [phone] (home)    ? ?Additional phone number:  ? ?Household Members/Support Persons (HM/SP):   Household Member/Support Person 1, Household Member/Support Person 2 ? ? ?HM/SP Name Relationship DOB or Age  ?HM/SP -1 Kelly Allen Significant Other 02/07/1984  ?HM/SP -2 Kelly Allen Daughter 08/12/2020  ?HM/SP -3        ?HM/SP -4        ?HM/SP -5        ?HM/SP -6        ?HM/SP -7        ?HM/SP -8        ? ? ?Natural Supports (not living in the home):  Immediate Family, Friends  ? ?Professional Supports: None  ? ?Employment: Unemployed  ? ?Type of Work:    ? ?Education:  High school graduate  ? ?Homebound arranged:   ? ?Financial Resources:  Medicaid  ? ?Other Resources:  WIC, Food Stamps    ? ?Cultural/Religious Considerations Which May Impact Care:   ? ?Strengths:  Ability to meet basic needs  , Pediatrician chosen, Home prepared for child    ? ?Psychotropic Medications:        ? ?Pediatrician:    [facility] (including [place]) ? ?Pediatrician List:  ? ?[place]    ?[place]    ?[facility]    ?[facility]    ?[facility]    ?[facility] [place]  ? ? ?Pediatrician Fax Number:   ? ?Risk Factors/Current Problems:  Substance Use    ? ?Cognitive State:  Alert  , Goal Oriented  , Linear Thinking  , Insightful    ? ?Mood/Affect:  Interested  , Comfortable  , Calm    ? ?CSW Assessment: CSW consulted for a history of THC use. CSW met with MOB to  assess and provide support. CSW introduced self and role. CSW observed MOB holding infant and FOB present in chair. CSW requested to speak with MOB privately, however she declined and allowed FOB to remain in room. FOB was not involved in conversation. CSW informed MOB of the reason for consult and assessed current mood. MOB shared she is doing well. MOB disclosed a history of anxiety, which she was diagnosed with at age 26. MOB has never treated with medication or therapy. MOB stated she had some anxiety during the pregnancy and it was manageable. 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 MOB shared she had some anxiety and depression. MOB coped by talking to friends and family. MOB denies any current SI or HI. MOB  ? ?CSW inquired on MOB substance use history. MOB stated she used CBD gummies as recent as two days ago for nausea. MOB denies any additional substance use. CSW informed MOB of the hospital drug screen policy. MOB aware infant UDS is negative and CDS will be followed. CSW informed MOB a CPS report will be made if infant test positive. MOB expressed understanding  and denied any questions.  ? ?CSW provided education regarding the baby blues period versus PPD and provided resources. CSW provided the New Mom Checklist and encouarged MOB to self evaluate   ?CSW provided review of Sudden Infant Death Syndrome (SIDS) precautions. MOB has all infant essentials. ? ?CSW will continue to follow CDS and make a CPS report if warranted. CSW identifies no further need for intervention and no barriers to discharge at this time. ? ?CSW Plan/Description:  No Further Intervention Required/No Barriers to Discharge, CSW Will Continue to Monitor Umbilical Cord Tissue Drug Screen Results and Make Report if Warranted, Child Protective Service Report  , Hospital Drug Screen Policy Information, Perinatal Mood and Anxiety Disorder (PMADs) Education, Sudden Infant Death Syndrome (SIDS)  Education, Other Information/Referral to [employer], Other Patient/Family Education  ? ? ?Darra Lis, LCSW ?03/30/2022, 10:42 AM ? ?

## 2022-03-31 ENCOUNTER — Encounter: Payer: Self-pay | Admitting: *Deleted

## 2022-03-31 ENCOUNTER — Encounter: Payer: Medicaid Other | Admitting: Obstetrics & Gynecology

## 2022-04-01 ENCOUNTER — Encounter: Payer: Medicaid Other | Admitting: Obstetrics and Gynecology

## 2022-04-02 ENCOUNTER — Inpatient Hospital Stay (HOSPITAL_COMMUNITY)
Admission: AD | Admit: 2022-04-02 | Discharge: 2022-04-03 | Disposition: A | Discharge status: Home / Self Care | Payer: Medicaid Other | Attending: Obstetrics & Gynecology | Admitting: Obstetrics & Gynecology

## 2022-04-02 ENCOUNTER — Encounter: Payer: Self-pay | Admitting: Advanced Practice Midwife

## 2022-04-02 ENCOUNTER — Encounter (HOSPITAL_COMMUNITY): Payer: Self-pay | Admitting: Obstetrics & Gynecology

## 2022-04-02 ENCOUNTER — Other Ambulatory Visit: Payer: Self-pay

## 2022-04-02 DIAGNOSIS — F17200 Nicotine dependence, unspecified, uncomplicated: Secondary | ICD-10-CM | POA: Diagnosis not present

## 2022-04-02 DIAGNOSIS — K219 Gastro-esophageal reflux disease without esophagitis: Secondary | ICD-10-CM | POA: Insufficient documentation

## 2022-04-02 NOTE — MAU Note (Signed)
..  Kelly Allen is a 26 y.o. at [facility] here in MAU reporting: Abodminal pain that began Monday night and vaginal bleeding that started getting heavier yesterday. Reports that the pain is intermittent and it feels like contractions and radiates down to her vagina.  ?Reports she has filled 6 maxipads today. No clots ?Pain score: 10/10 ?Vitals:  ? 04/02/22 2342  ?BP: (!) 138/96  ?Pulse: (!) 122  ?Resp: 20  ?Temp: 99.4 ?F (37.4 ?C)  ?SpO2: 98%  ?   ? ? ? ?

## 2022-04-03 ENCOUNTER — Encounter (HOSPITAL_COMMUNITY): Payer: Self-pay | Admitting: Obstetrics & Gynecology

## 2022-04-03 ENCOUNTER — Inpatient Hospital Stay (EMERGENCY_DEPARTMENT_HOSPITAL): Payer: Medicaid Other | Admitting: Certified Registered"

## 2022-04-03 ENCOUNTER — Other Ambulatory Visit: Payer: Self-pay

## 2022-04-03 ENCOUNTER — Encounter (HOSPITAL_COMMUNITY)
Admission: AD | Disposition: A | Discharge status: Home / Self Care | Payer: Self-pay | Source: Home / Self Care | Attending: Obstetrics & Gynecology

## 2022-04-03 ENCOUNTER — Inpatient Hospital Stay (HOSPITAL_COMMUNITY): Payer: Medicaid Other | Admitting: Certified Registered"

## 2022-04-03 ENCOUNTER — Inpatient Hospital Stay (HOSPITAL_COMMUNITY): Payer: Medicaid Other

## 2022-04-03 HISTORY — PX: DILATION AND CURETTAGE OF UTERUS: SHX78

## 2022-04-03 LAB — CBC
HCT: 35.1 % — ABNORMAL LOW (ref 36.0–46.0)
HCT: 29.7 % — ABNORMAL LOW (ref 36.0–46.0)
Hemoglobin: 11.5 g/dL — ABNORMAL LOW (ref 12.0–15.0)
Hemoglobin: 9.9 g/dL — ABNORMAL LOW (ref 12.0–15.0)
MCH: 30.7 pg (ref 26.0–34.0)
MCH: 31.2 pg (ref 26.0–34.0)
MCHC: 32.8 g/dL (ref 30.0–36.0)
MCHC: 33.3 g/dL (ref 30.0–36.0)
MCV: 93.6 fL (ref 80.0–100.0)
MCV: 93.7 fL (ref 80.0–100.0)
Platelets: 314 10*3/uL (ref 150–400)
Platelets: 258 10*3/uL (ref 150–400)
RBC: 3.75 MIL/uL — ABNORMAL LOW (ref 3.87–5.11)
RBC: 3.17 MIL/uL — ABNORMAL LOW (ref 3.87–5.11)
RDW: 13.1 % (ref 11.5–15.5)
RDW: 13 % (ref 11.5–15.5)
WBC: 18.6 10*3/uL — ABNORMAL HIGH (ref 4.0–10.5)
WBC: 15.6 10*3/uL — ABNORMAL HIGH (ref 4.0–10.5)
nRBC: 0 % (ref 0.0–0.2)
nRBC: 0 % (ref 0.0–0.2)

## 2022-04-03 LAB — COMPREHENSIVE METABOLIC PANEL
ALT: 19 U/L (ref 0–44)
AST: 8 U/L — ABNORMAL LOW (ref 15–41)
Albumin: 2.1 g/dL — ABNORMAL LOW (ref 3.5–5.0)
Alkaline Phosphatase: 94 U/L (ref 38–126)
Anion gap: 4 — ABNORMAL LOW (ref 5–15)
BUN: 10 mg/dL (ref 6–20)
CO2: 19 mmol/L — ABNORMAL LOW (ref 22–32)
Calcium: 7.1 mg/dL — ABNORMAL LOW (ref 8.9–10.3)
Chloride: 118 mmol/L — ABNORMAL HIGH (ref 98–111)
Creatinine, Ser: 0.66 mg/dL (ref 0.44–1.00)
GFR, Estimated: >60 mL/min (ref >60)
Glucose, Bld: 89 mg/dL (ref 70–99)
Potassium: 4.2 mmol/L (ref 3.5–5.1)
Sodium: 141 mmol/L (ref 135–145)
Total Bilirubin: 0.4 mg/dL (ref 0.3–1.2)
Total Protein: 4.7 g/dL — ABNORMAL LOW (ref 6.5–8.1)

## 2022-04-03 LAB — RPR: RPR Ser Ql: NONREACTIVE

## 2022-04-03 LAB — TYPE AND SCREEN
ABO/RH(D): O POS
Antibody Screen: NEGATIVE

## 2022-04-03 SURGERY — DILATION AND CURETTAGE
Anesthesia: General | Site: Vagina

## 2022-04-03 MED ORDER — PROPOFOL 10 MG/ML IV BOLUS
INTRAVENOUS | Status: DC | PRN
  Administered 2022-04-03: 200 mg via INTRAVENOUS

## 2022-04-03 MED ORDER — FENTANYL CITRATE (PF) 250 MCG/5ML IJ SOLN
INTRAMUSCULAR | Status: AC
  Filled 2022-04-03: qty 5

## 2022-04-03 MED ORDER — FENTANYL CITRATE (PF) 250 MCG/5ML IJ SOLN
INTRAMUSCULAR | Status: DC | PRN
Start: 2022-04-03 — End: 2022-04-03
  Administered 2022-04-03: 50 ug via INTRAVENOUS

## 2022-04-03 MED ORDER — MIDAZOLAM HCL 2 MG/2ML IJ SOLN
INTRAMUSCULAR | Status: DC | PRN
  Administered 2022-04-03: 2 mg via INTRAVENOUS

## 2022-04-03 MED ORDER — LIDOCAINE 2% (20 MG/ML) 5 ML SYRINGE
INTRAMUSCULAR | Status: AC
  Filled 2022-04-03: qty 5

## 2022-04-03 MED ORDER — HYDROMORPHONE HCL 1 MG/ML IJ SOLN
1.0000 mg | Freq: Once | INTRAMUSCULAR | Status: AC
  Administered 2022-04-03: 1 mg via INTRAVENOUS
  Filled 2022-04-03: qty 1

## 2022-04-03 MED ORDER — SCOPOLAMINE 1 MG/3DAYS TD PT72
MEDICATED_PATCH | TRANSDERMAL | Status: AC
  Filled 2022-04-03: qty 1

## 2022-04-03 MED ORDER — POVIDONE-IODINE 10 % EX SWAB
2.0000 "application " | Freq: Once | CUTANEOUS | Status: AC
  Administered 2022-04-03: 2 via TOPICAL

## 2022-04-03 MED ORDER — PROPOFOL 10 MG/ML IV BOLUS
INTRAVENOUS | Status: AC
  Filled 2022-04-03: qty 20

## 2022-04-03 MED ORDER — METHYLERGONOVINE MALEATE 0.2 MG/ML IJ SOLN
INTRAMUSCULAR | Status: AC
  Filled 2022-04-03: qty 1

## 2022-04-03 MED ORDER — CHLORHEXIDINE GLUCONATE 0.12 % MT SOLN
OROMUCOSAL | Status: AC
Start: 2022-04-03 — End: 2022-04-03
  Administered 2022-04-03: 15 mL via OROMUCOSAL
  Filled 2022-04-03: qty 15

## 2022-04-03 MED ORDER — HYDROMORPHONE HCL 1 MG/ML IJ SOLN: 0.2500 mg | INTRAMUSCULAR | Status: DC | PRN

## 2022-04-03 MED ORDER — CHLORHEXIDINE GLUCONATE 0.12 % MT SOLN: 15.0000 mL | Freq: Once | OROMUCOSAL | Status: AC

## 2022-04-03 MED ORDER — SODIUM CHLORIDE 0.9 % IV SOLN: INTRAVENOUS | Status: DC

## 2022-04-03 MED ORDER — KETOROLAC TROMETHAMINE 30 MG/ML IJ SOLN
30.0000 mg | Freq: Once | INTRAMUSCULAR | Status: AC
Start: 2022-04-03 — End: 2022-04-03
  Administered 2022-04-03: 30 mg via INTRAVENOUS
  Filled 2022-04-03: qty 1

## 2022-04-03 MED ORDER — KETOROLAC TROMETHAMINE 30 MG/ML IJ SOLN
30.0000 mg | Freq: Once | INTRAMUSCULAR | Status: AC
  Administered 2022-04-03: 30 mg via INTRAMUSCULAR
  Filled 2022-04-03: qty 1

## 2022-04-03 MED ORDER — BUPIVACAINE HCL 0.5 % IJ SOLN
INTRAMUSCULAR | Status: DC | PRN
  Administered 2022-04-03: 30 mL

## 2022-04-03 MED ORDER — LACTATED RINGERS IV SOLN: INTRAVENOUS | Status: DC | PRN

## 2022-04-03 MED ORDER — PROMETHAZINE HCL 25 MG/ML IJ SOLN: 6.2500 mg | INTRAMUSCULAR | Status: DC | PRN

## 2022-04-03 MED ORDER — GLYCOPYRROLATE PF 0.2 MG/ML IJ SOSY
PREFILLED_SYRINGE | INTRAMUSCULAR | Status: AC
  Filled 2022-04-03: qty 1

## 2022-04-03 MED ORDER — METHYLERGONOVINE MALEATE 0.2 MG/ML IJ SOLN
0.2000 mg | INTRAMUSCULAR | Status: AC
Start: 2022-04-03 — End: 2022-04-03
  Administered 2022-04-03: 0.2 mg via INTRAMUSCULAR
  Filled 2022-04-03: qty 1

## 2022-04-03 MED ORDER — SCOPOLAMINE 1 MG/3DAYS TD PT72
MEDICATED_PATCH | TRANSDERMAL | Status: DC | PRN
  Administered 2022-04-03: 1 via TRANSDERMAL

## 2022-04-03 MED ORDER — ONDANSETRON HCL 4 MG/2ML IJ SOLN
INTRAMUSCULAR | Status: AC
  Filled 2022-04-03: qty 2

## 2022-04-03 MED ORDER — ORAL CARE MOUTH RINSE: 15.0000 mL | Freq: Once | OROMUCOSAL | Status: AC

## 2022-04-03 MED ORDER — 0.9 % SODIUM CHLORIDE (POUR BTL) OPTIME
TOPICAL | Status: DC | PRN
  Administered 2022-04-03: 1000 mL

## 2022-04-03 MED ORDER — MEPERIDINE HCL 25 MG/ML IJ SOLN: 6.2500 mg | INTRAMUSCULAR | Status: DC | PRN

## 2022-04-03 MED ORDER — MIDAZOLAM HCL 2 MG/2ML IJ SOLN
INTRAMUSCULAR | Status: AC
Start: 2022-04-03 — End: ?
  Filled 2022-04-03: qty 2

## 2022-04-03 MED ORDER — OXYCODONE HCL 5 MG PO TABS: 5.0000 mg | ORAL_TABLET | Freq: Once | ORAL | Status: DC | PRN

## 2022-04-03 MED ORDER — OXYCODONE HCL 5 MG PO TABS: 5.0000 mg | ORAL_TABLET | ORAL | 0 refills | Status: DC | PRN

## 2022-04-03 MED ORDER — LACTATED RINGERS IV SOLN: INTRAVENOUS | Status: DC

## 2022-04-03 MED ORDER — DEXTROSE 5 % IV SOLN
200.0000 mg | INTRAVENOUS | Status: AC
  Administered 2022-04-03: 200 mg via INTRAVENOUS
  Filled 2022-04-03: qty 200

## 2022-04-03 MED ORDER — AMISULPRIDE (ANTIEMETIC) 5 MG/2ML IV SOLN: 10.0000 mg | Freq: Once | INTRAVENOUS | Status: DC | PRN

## 2022-04-03 MED ORDER — DOCUSATE SODIUM 100 MG PO CAPS: 100.0000 mg | ORAL_CAPSULE | Freq: Two times a day (BID) | ORAL | 2 refills | Status: DC | PRN

## 2022-04-03 MED ORDER — METHYLERGONOVINE MALEATE 0.2 MG/ML IJ SOLN
INTRAMUSCULAR | Status: DC | PRN
  Administered 2022-04-03: .2 mg via INTRAMUSCULAR

## 2022-04-03 MED ORDER — BUPIVACAINE HCL (PF) 0.5 % IJ SOLN
INTRAMUSCULAR | Status: AC
  Filled 2022-04-03: qty 30

## 2022-04-03 MED ORDER — LIDOCAINE HCL (CARDIAC) PF 100 MG/5ML IV SOSY
PREFILLED_SYRINGE | INTRAVENOUS | Status: DC | PRN
  Administered 2022-04-03: 80 mg via INTRATRACHEAL

## 2022-04-03 MED ORDER — ONDANSETRON HCL 4 MG/2ML IJ SOLN
4.0000 mg | Freq: Four times a day (QID) | INTRAMUSCULAR | Status: DC | PRN
  Administered 2022-04-03: 4 mg via INTRAVENOUS
  Filled 2022-04-03: qty 2

## 2022-04-03 MED ORDER — IBUPROFEN 600 MG PO TABS: 600.0000 mg | ORAL_TABLET | Freq: Four times a day (QID) | ORAL | 1 refills | Status: DC | PRN

## 2022-04-03 MED ORDER — OXYCODONE HCL 5 MG/5ML PO SOLN: 5.0000 mg | Freq: Once | ORAL | Status: DC | PRN

## 2022-04-03 MED ORDER — GLYCOPYRROLATE 0.2 MG/ML IJ SOLN
INTRAMUSCULAR | Status: DC | PRN
Start: 2022-04-03 — End: 2022-04-03
  Administered 2022-04-03: .2 mg via INTRAVENOUS

## 2022-04-03 SURGICAL SUPPLY — 16 items
CATH ROBINSON RED A/P 16FR (CATHETERS) ×3 IMPLANT
CNTNR URN SCR LID CUP LEK RST (MISCELLANEOUS) ×1 IMPLANT
CONT SPEC 4OZ STRL OR WHT (MISCELLANEOUS) ×1
DECANTER SPIKE VIAL GLASS SM (MISCELLANEOUS) ×1 IMPLANT
GAUZE 4X4 16PLY ~~LOC~~+RFID DBL (SPONGE) ×2 IMPLANT
GLOVE ECLIPSE 7.0 STRL STRAW (GLOVE) ×2 IMPLANT
GLOVE SS BIOGEL STRL SZ 7 (GLOVE) IMPLANT
GLOVE SUPERSENSE BIOGEL SZ 7 (GLOVE) ×1
GOWN STRL REUS W/ TWL LRG LVL3 (GOWN DISPOSABLE) ×2 IMPLANT
GOWN STRL REUS W/TWL LRG LVL3 (GOWN DISPOSABLE) ×2
KIT TURNOVER KIT B (KITS) ×2 IMPLANT
PACK VAGINAL MINOR WOMEN LF (CUSTOM PROCEDURE TRAY) ×2 IMPLANT
PAD OB MATERNITY 4.3X12.25 (PERSONAL CARE ITEMS) ×2 IMPLANT
SET BERKELEY SUCTION TUBING (SUCTIONS) ×1 IMPLANT
TOWEL GREEN STERILE FF (TOWEL DISPOSABLE) ×4 IMPLANT
UNDERPAD 30X36 HEAVY ABSORB (UNDERPADS AND DIAPERS) ×2 IMPLANT

## 2022-04-03 NOTE — Progress Notes (Signed)
Noted to prior to discharge patients peri pad had small amount of red drainage patient alert talkative skin warm and dry respirations even unlabored skin warm and dry ?

## 2022-04-03 NOTE — Anesthesia Postprocedure Evaluation (Signed)
Anesthesia Post Note ? ?Patient: Kelly Allen ? ?Procedure(s) Performed: SUCTION DILATATION AND CURETTAGE (Vagina ) ? ?  ? ?Patient location during evaluation: PACU ?Anesthesia Type: General ?Level of consciousness: awake and alert ?Pain management: pain level controlled ?Vital Signs Assessment: post-procedure vital signs reviewed and stable ?Respiratory status: spontaneous breathing, nonlabored ventilation and respiratory function stable ?Cardiovascular status: blood pressure returned to baseline and stable ?Postop Assessment: no apparent nausea or vomiting ?Anesthetic complications: no ? ? ?No notable events documented. ? ?Last Vitals:  ?Vitals:  ? 04/03/22 1130 04/03/22 1145  ?BP: 113/74 (!) 113/92  ?Pulse: (!) 59 (!) 50  ?Resp: 16 (!) 6  ?Temp:    ?SpO2: 97% 98%  ?  ?Last Pain:  ?Vitals:  ? 04/03/22 1130  ?TempSrc:   ?PainSc: 0-No pain  ? ? ?  ?  ?  ?  ?  ?  ? ?Lynda Rainwater ? ? ? ? ?

## 2022-04-03 NOTE — Progress Notes (Signed)
Patient up to chair alert talkative tolerated PO liquids and crackers awaiting family for pick up ?

## 2022-04-03 NOTE — Interval H&P Note (Signed)
History and Physical Interval Note ?04/03/2022 ?9:05 AM ? ?Took over the care of this patient from Dr. Debroah Loop.  The patient's history has been reviewed, patient examined, no change in status, stable for surgery.  I have reviewed the patient's chart and labs.  Kelly Allen has a diagnosis of retained products of conception PPD#5 after recent delivery.  The various methods of treatment have been discussed with the patient and family.  After consideration of risks, benefits and other options for treatment, the patient has consented to  Procedure(s): SUCTION DILATATION AND CURETTAGE  as a surgical intervention.  Risks of surgery re-reviewed including bleeding, infection, injury to surrounding organs, need for additional procedures, possibility of intrauterine scarring which may impair future fertility, risk of retained products which may require further management and other postoperative/anesthesia complications were explained to patient.  Likelihood of success of complete evacuation of the uterus was discussed with the patient.   Questions were answered to the patient's satisfaction.  Written informed consent was obtained.  Patient has been NPO since last night  she will remain NPO for procedure. Anesthesia and OR aware.  Preoperative prophylactic Doxycycline 200mg  IV  has been ordered and is on call to the OR.  To OR when ready.  ? ? ? , MD, FACOG ?Obstetrician Jaynie Collins, Faculty Practice ?Center for [facility] manager, [facility] ?

## 2022-04-03 NOTE — MAU Provider Note (Signed)
Chief Complaint:  Abdominal Pain and Vaginal Bleeding ? ? Event Date/Time  ? First Provider Initiated Contact with Patient 04/03/22 0014   ?  ? ?HPI: Kelly Allen is a 26 y.o. [phone] who presents to maternity admissions reporting vaginal bleeding with clots and severe pelvic cramping since Monday night.  Soaked 6 maxipads.  S/P SVD on 03/29/22   Has a history of PPH, was treated with TXA at delivery.  No PPH in hospital  ?She reports vaginal bleeding, no vaginal itching/burning, urinary symptoms, h/a, dizziness, n/v, or fever/chills.   ? ?Abdominal Pain ?This is a new problem. The current episode started in the past 7 days. The problem occurs intermittently. The problem has been unchanged. The pain is severe. The quality of the pain is cramping. The abdominal pain does not radiate. Pertinent negatives include no constipation, diarrhea, dysuria or fever. Nothing aggravates the pain. The pain is relieved by Nothing. She has tried nothing for the symptoms.  ?Vaginal Bleeding ?The patient's primary symptoms include pelvic pain and vaginal bleeding. The patient's pertinent negatives include no genital itching. The current episode started in the past 7 days. The problem occurs constantly. She is not pregnant. Associated symptoms include abdominal pain. Pertinent negatives include no constipation, diarrhea, dysuria or fever. The vaginal discharge was bloody. The vaginal bleeding is heavier than menses. She has been passing clots. She has been passing tissue. Nothing aggravates the symptoms. She has tried nothing for the symptoms.  ?RN Note: ?Kelly Allen is a 26 y.o. at [facility] here in MAU reporting: Abodminal pain that began Monday night and vaginal bleeding that started getting heavier yesterday. Reports that the pain is intermittent and it feels like contractions and radiates down to her vagina.  ?Reports she has filled 6 maxipads today. No clots ?Pain score: 10/10 ? ?Past Medical History: ?Past Medical History:   ?Diagnosis Date  ? Acid reflux   ? IBS (irritable bowel syndrome)   ? ? ?Past obstetric history: ?OB History  ?Gravida Para Term Preterm AB Living  ?2 2 2     2   ?SAB IAB Ectopic Multiple Live Births  ?      0 2  ?  ?# Outcome Date GA Lbr Len/2nd Weight Sex Delivery Anes PTL Lv  ?2 Term 03/29/22 [redacted]w[redacted]d 02:08 / 00:09 3390 g F Vag-Spont EPI  LIV  ?   Birth Comments: none  ?1 Term 08/13/19 [redacted]w[redacted]d   F Vag-Spont   LIV  ? ? ?Past Surgical History: ?Past Surgical History:  ?Procedure Laterality Date  ? CHOLECYSTECTOMY    ? WISDOM TOOTH EXTRACTION    ? ? ?Family History: ?Family History  ?Problem Relation Age of Onset  ? Diabetes Mother   ? Asthma Father   ? COPD Father   ? Cancer Paternal Grandmother   ?     stomach  ? ? ?Social History: ?Social History  ? ?Tobacco Use  ? Smoking status: Every Day  ?  Packs/day: 0.25  ?  Years: 7.00  ?  Pack years: 1.75  ?  Types: Cigarettes  ? Smokeless tobacco: Never  ?Vaping Use  ? Vaping Use: Former  ? Substances: Nicotine  ?Substance Use Topics  ? Alcohol use: Not Currently  ? Drug use: Not Currently  ? ? ?Allergies: No Known Allergies ? ?Meds:  ?Medications Prior to Admission  ?Medication Sig Dispense Refill Last Dose  ? cyclobenzaprine (FLEXERIL) 10 MG tablet Take 1 tablet (10 mg total) by mouth every 8 (eight) hours  as needed for muscle spasms. 30 tablet 1   ? famotidine (PEPCID) 20 MG tablet Take 1 tablet (20 mg total) by mouth 2 (two) times daily. 60 tablet 3   ? ibuprofen (ADVIL) 600 MG tablet Take 1 tablet (600 mg total) by mouth every 6 (six) hours. 30 tablet 0   ? ondansetron (ZOFRAN ODT) 8 MG disintegrating tablet Take 1 tablet (8 mg total) by mouth every 8 (eight) hours as needed for nausea or vomiting. 20 tablet 3   ? Prenatal Vit-Fe Fumarate-FA (PRENATAL VITAMIN PO) Take by mouth.     ? ? ?I have reviewed patient's Past Medical Hx, Surgical Hx, Family Hx, Social Hx, medications and allergies. ? ?ROS:  ?Review of Systems  ?Constitutional:  Negative for fever.   ?Gastrointestinal:  Positive for abdominal pain. Negative for constipation and diarrhea.  ?Genitourinary:  Positive for pelvic pain and vaginal bleeding. Negative for dysuria.  ?Other systems negative ? ?  ? ?Physical Exam  ?Patient Vitals for the past 24 hrs: ? BP Temp Temp src Pulse Resp SpO2  ?04/02/22 2342 (!) 138/96 99.4 ?F (37.4 ?C) Oral (!) 122 20 98 %  ? ?Constitutional: Well-developed, well-nourished female in no acute distress.  ?Cardiovascular: normal rate and rhythm ?Respiratory: normal effort, no distress. ?GI: Abd soft, moderately tender.  Nondistended.  No rebound, No guarding.  Bowel Sounds audible  ?MS: Extremities nontender, no edema, normal ROM ?Neurologic: Alert and oriented x 4.   Grossly nonfocal. ?GU: Neg CVAT. ?Skin:  Warm and Dry ?Psych:  Affect appropriate. ? ?PELVIC EXAM: Moderate lochia. ?  ?Labs: ?Results for orders placed or performed during the hospital encounter of 04/02/22 (from the past 24 hour(s))  ?CBC     Status: Abnormal  ? Collection Time: 04/03/22  1:08 AM  ?Result Value Ref Range  ? WBC 18.6 (H) 4.0 - 10.5 K/uL  ? RBC 3.75 (L) 3.87 - 5.11 MIL/uL  ? Hemoglobin 11.5 (L) 12.0 - 15.0 g/dL  ? HCT 35.1 (L) 36.0 - 46.0 %  ? MCV 93.6 80.0 - 100.0 fL  ? MCH 30.7 26.0 - 34.0 pg  ? MCHC 32.8 30.0 - 36.0 g/dL  ? RDW 13.1 11.5 - 15.5 %  ? Platelets 314 150 - 400 K/uL  ? nRBC 0.0 0.0 - 0.2 %  ?Type and screen     Status: None  ? Collection Time: 04/03/22  1:09 AM  ?Result Value Ref Range  ? ABO/RH(D) O POS   ? Antibody Screen NEG   ? Sample Expiration    ?  04/06/2022,2359 ?Performed at [facility], [address] [place], [place] [address] ?  ? ? ?--/--/O POS (04/28 1608) ? ?Imaging:  ?US PELVIS (TRANSABDOMINAL ONLY) ? ?Result Date: 04/03/2022 ?CLINICAL DATA:  Abdominal pain.  Vaginal delivery on 03/29/2022. EXAM: TRANSABDOMINAL ULTRASOUND OF PELVIS TECHNIQUE: Transabdominal ultrasound examination of the pelvis was performed including evaluation of the uterus, ovaries, adnexal  regions, and pelvic cul-de-sac. COMPARISON:  None Available. FINDINGS: Uterus Measurements: 13.6 x 7.2 x 8.0 cm = volume: 408 mL. No fibroids or other mass visualized. Endometrium Thickness: 4.6 cm. Endometrium is heterogeneous and thickened containing a large amount of internal vascularity along the posterior fundal region. Right ovary Measurements: 1.9 x 1.4 x 1.8 cm = volume: 2.5 mL. Normal appearance/no adnexal mass. Left ovary Measurements: 2.8 x 1.8 x 2.3 cm = volume: 6.2 mL. Normal appearance/no adnexal mass. Other findings:  No abnormal free fluid. IMPRESSION: 1. The endometrium is heterogeneous and  thickened. There is a large amount of internal vascularity along the posterior fundal portion. Findings are concerning for retained products of conception. Arteriovenous malformation not excluded given this appearance. Electronically Signed   By: Darliss CheneyAmy  Guttmann M.D.   On: 04/03/2022 00:48   ? ? ?MAU Course/MDM: ?I have ordered labs as follows: see above. Hgb at delivery was 12.  ?Imaging ordered: US pelvis which showed retained POCs ?Results reviewed.  ? Consult Dr Debroah LoopArnold.  He recommends D&C in AM.   ?Treatments in MAU included IV fluids, Toradol for pain, Dilaudid for pain.  ?Got pretty good relief from Toradol for several hours but then required Dilaudid.   ?Pt stable at time of transfer ? ?Assessment: ?Postpartum Day #5 ?Retained Placental tissue with hemorrhage ? ?Plan: ?Hold on MAU until time for surgery ?D&C scheduled in AM ? ? ?Wynelle BourgeoisMarie Anai Allen CNM, MSN ?Certified Nurse-Midwife ?04/03/2022 ?12:14 AM ?

## 2022-04-03 NOTE — H&P (Signed)
Kelly Allen is an 26 y.o. female. [id number] ?Kelly Allen is a 26 y.o. at [facility] here in MAU reporting: Abodminal pain that began Monday night and vaginal bleeding that started getting heavier yesterday. Reports that the pain is intermittent and it feels like contractions and radiates down to her vagina.  ?Reports she has filled 6 maxipads today. No clots ?Pain score: 10/10 ?US shows possible retained POC cannot rule out AVM ? ? ?Menstrual History: ?No LMP recorded. ?  ? ?Past Medical History:  ?Diagnosis Date  ? Acid reflux   ? IBS (irritable bowel syndrome)   ? ? ?Past Surgical History:  ?Procedure Laterality Date  ? CHOLECYSTECTOMY    ? WISDOM TOOTH EXTRACTION    ? ? ?Family History  ?Problem Relation Age of Onset  ? Diabetes Mother   ? Asthma Father   ? COPD Father   ? Cancer Paternal Grandmother   ?     stomach  ? ? ?Social History:  reports that she has been smoking cigarettes. She has a 1.75 pack-year smoking history. She has never used smokeless tobacco. She reports that she does not currently use alcohol. She reports that she does not currently use drugs. ? ?Allergies: No Known Allergies ? ?Medications Prior to Admission  ?Medication Sig Dispense Refill Last Dose  ? cyclobenzaprine (FLEXERIL) 10 MG tablet Take 1 tablet (10 mg total) by mouth every 8 (eight) hours as needed for muscle spasms. 30 tablet 1   ? famotidine (PEPCID) 20 MG tablet Take 1 tablet (20 mg total) by mouth 2 (two) times daily. 60 tablet 3   ? ibuprofen (ADVIL) 600 MG tablet Take 1 tablet (600 mg total) by mouth every 6 (six) hours. 30 tablet 0   ? ondansetron (ZOFRAN ODT) 8 MG disintegrating tablet Take 1 tablet (8 mg total) by mouth every 8 (eight) hours as needed for nausea or vomiting. 20 tablet 3   ? Prenatal Vit-Fe Fumarate-FA (PRENATAL VITAMIN PO) Take by mouth.     ? ? ?Review of Systems  ?Respiratory: Negative.    ?Cardiovascular: Negative.   ?Gastrointestinal: Negative.   ?Genitourinary:  Positive for pelvic pain and vaginal  bleeding.  ? ?Blood pressure (!) 138/96, pulse (!) 122, temperature 99.4 ?F (37.4 ?C), temperature source Oral, resp. rate 20, SpO2 98 %, unknown if currently breastfeeding. ?Physical Exam ?Vitals and nursing note reviewed.  ?Constitutional:   ?   Appearance: She is not ill-appearing.  ?HENT:  ?   Head: Normocephalic and atraumatic.  ?Cardiovascular:  ?   Rate and Rhythm: Normal rate.  ?Pulmonary:  ?   Effort: Pulmonary effort is normal.  ?Abdominal:  ?   General: Abdomen is flat.  ?   Palpations: Abdomen is soft.  ?Skin: ?   General: Skin is warm and dry.  ?Neurological:  ?   General: No focal deficit present.  ?   Mental Status: She is alert.  ? ? ?Results for orders placed or performed during the hospital encounter of 04/02/22 (from the past 24 hour(s))  ?CBC     Status: Abnormal  ? Collection Time: 04/03/22  1:08 AM  ?Result Value Ref Range  ? WBC 18.6 (H) 4.0 - 10.5 K/uL  ? RBC 3.75 (L) 3.87 - 5.11 MIL/uL  ? Hemoglobin 11.5 (L) 12.0 - 15.0 g/dL  ? HCT 35.1 (L) 36.0 - 46.0 %  ? MCV 93.6 80.0 - 100.0 fL  ? MCH 30.7 26.0 - 34.0 pg  ? MCHC 32.8 30.0 -  36.0 g/dL  ? RDW 13.1 11.5 - 15.5 %  ? Platelets 314 150 - 400 K/uL  ? nRBC 0.0 0.0 - 0.2 %  ?Type and screen     Status: None  ? Collection Time: 04/03/22  1:09 AM  ?Result Value Ref Range  ? ABO/RH(D) O POS   ? Antibody Screen NEG   ? Sample Expiration    ?  04/06/2022,2359 ?Performed at [facility], [address], [place], [place] [address] ?  ? ? ?US PELVIS (TRANSABDOMINAL ONLY) ? ?Result Date: 04/03/2022 ?CLINICAL DATA:  Abdominal pain.  Vaginal delivery on 03/29/2022. EXAM: TRANSABDOMINAL ULTRASOUND OF PELVIS TECHNIQUE: Transabdominal ultrasound examination of the pelvis was performed including evaluation of the uterus, ovaries, adnexal regions, and pelvic cul-de-sac. COMPARISON:  None Available. FINDINGS: Uterus Measurements: 13.6 x 7.2 x 8.0 cm = volume: 408 mL. No fibroids or other mass visualized. Endometrium Thickness: 4.6 cm. Endometrium is  heterogeneous and thickened containing a large amount of internal vascularity along the posterior fundal region. Right ovary Measurements: 1.9 x 1.4 x 1.8 cm = volume: 2.5 mL. Normal appearance/no adnexal mass. Left ovary Measurements: 2.8 x 1.8 x 2.3 cm = volume: 6.2 mL. Normal appearance/no adnexal mass. Other findings:  No abnormal free fluid. IMPRESSION: 1. The endometrium is heterogeneous and thickened. There is a large amount of internal vascularity along the posterior fundal portion. Findings are concerning for retained products of conception. Arteriovenous malformation not excluded given this appearance. Electronically Signed   By: Darliss Cheney M.D.   On: 04/03/2022 00:48   ? ?Assessment/Plan: ?Patient desires surgical management with suction D&E.  The risks of surgery were discussed in detail with the patient including but not limited to: bleeding which may require transfusion or reoperation; infection which may require prolonged hospitalization or re-hospitalization and antibiotic therapy; injury to bowel, bladder, ureters and major vessels or other surrounding organs which may lead to other procedures; formation of adhesions; need for additional procedures including laparotomy or subsequent procedures secondary to intraoperative injury or abnormal pathology; thromboembolic phenomenon; incisional problems and other postoperative or anesthesia complications.  Patient was told that the likelihood that her condition and symptoms will be treated effectively with this surgical management was very high; the postoperative expectations were also discussed in detail. The patient also understands the alternative treatment options which were discussed in full. All questions were answered.   ? ?Scheryl Darter ?04/03/2022, 2:42 AM ? ?

## 2022-04-03 NOTE — Op Note (Signed)
De Nurse ?PROCEDURE DATE: 04/03/2022 ? ?PREOPERATIVE DIAGNOSIS: Retained products of conception after delivery with hemorrhage ?POSTOPERATIVE DIAGNOSIS: The same ?PROCEDURE:     Dilation and Evacuation ?SURGEON:  Dr. Jaynie Collins ? ?INDICATIONS: 26 y.o. [id number] with retained products of conception with hemorrhage after delivery on 03/29/22, needing surgical completion.  Risks of surgery were discussed with the patient including but not limited to: bleeding which may require transfusion; infection which may require antibiotics; injury to uterus or surrounding organs; need for additional procedures including laparotomy or laparoscopy; possibility of intrauterine scarring which may impair future fertility; and other postoperative/anesthesia complications. Written informed consent was obtained.   ? ?FINDINGS:  13 week size uterus, large amount of blood clots, moderate amounts of products of conception, specimen sent to pathology. ? ?ANESTHESIA: General-LMA, paracervical block with 30 ml of 0.5% Marcaine. ?ESTIMATED BLOOD LOSS:  600 ml. ?SPECIMENS:  Products of conception sent to pathology ?COMPLICATIONS:  None immediate. ? ?PROCEDURE DETAILS:  The patient received intravenous Doxycycline while in the preoperative area.  She was then taken to the operating room where general anesthesia was administered and was found to be adequate.  After an adequate timeout was performed, she was placed in the dorsal lithotomy position and examined; then prepped and draped in the sterile manner.   Her bladder was catheterized for an unmeasured amount of clear, yellow urine. A vaginal speculum was then placed in the patient's vagina and a ring forceps was applied to the anterior lip of the cervix.  A paracervical block using 30 ml of 0.5% Marcaine was administered. The cervix was gently dilated to accommodate a 9 mm suction curette that was gently advanced to the uterine fundus. The suction device was then activated and curette  slowly rotated to clear the uterus of products of conception.  Suction curettage was done until complete emptying of the uterus was confirmed. There was significant bleeding noted and Methergine 0.2 mg IM was administered to help with vasoconstriction and hemostasis.  All instruments were removed from the patient's vagina.  Sponge and instrument counts were correct times three.  The patient tolerated the procedure well and was taken to the recovery area extubated, awake, and in stable condition. ? ?The patient will be discharged to home as per PACU criteria.  Routine postoperative instructions given.  She was prescribed Oxycodone, Ibuprofen and Colace.  She will follow up in the office in 2-3 weeks for postoperative evaluation ? ? ?Jaynie Collins, MD, FACOG ?Obstetrician Heritage manager, Faculty Practice ?Center for [employer], [facility] ?

## 2022-04-03 NOTE — Transfer of Care (Signed)
Immediate Anesthesia Transfer of Care Note ? ?Patient: Kelly Allen ? ?Procedure(s) Performed: SUCTION DILATATION AND CURETTAGE (Vagina ) ? ?Patient Location: PACU ? ?Anesthesia Type:General ? ?Level of Consciousness: oriented, drowsy, patient cooperative and responds to stimulation ? ?Airway & Oxygen Therapy: Patient Spontanous Breathing ? ?Post-op Assessment: Report given to RN, Post -op Vital signs reviewed and stable and Patient moving all extremities X 4 ? ?Post vital signs: Reviewed and stable ? ?Last Vitals:  ?Vitals Value Taken Time  ?BP 130/90 04/03/22 1100  ?Temp    ?Pulse 59 04/03/22 1103  ?Resp 20 04/03/22 1103  ?SpO2 99 % 04/03/22 1103  ?Vitals shown include unvalidated device data. ? ?Last Pain:  ?Vitals:  ? 04/03/22 0942  ?TempSrc: Oral  ?PainSc: 4   ?   ? ?Patients Stated Pain Goal: 0 (04/02/22 2342) ? ?Complications: No notable events documented. ?

## 2022-04-03 NOTE — Anesthesia Procedure Notes (Addendum)
Procedure Name: LMA Insertion ?Date/Time: 04/03/2022 10:20 AM ?Performed by: Melina Schools, CRNA ?Pre-anesthesia Checklist: Patient identified, Emergency Drugs available, Suction available, Patient being monitored and Timeout performed ?Patient Re-evaluated:Patient Re-evaluated prior to induction ?Oxygen Delivery Method: Circle system utilized ?Preoxygenation: Pre-oxygenation with 100% oxygen ?Ventilation: Mask ventilation without difficulty ?LMA: LMA inserted ?LMA Size: 4.0 ?Number of attempts: 1 ?Placement Confirmation: positive ETCO2 and breath sounds checked- equal and bilateral ?Tube secured with: Tape ?Dental Injury: Teeth and Oropharynx as per pre-operative assessment  ? ? ? ? ?

## 2022-04-03 NOTE — Progress Notes (Signed)
? ? ?  Faculty Practice OB/GYN Attending Note ? ?Subjective:  ?Called to evaluate patient with increased bleeding noted after recent procedure. Since she came to PACU, has soaked one pad. On arrival, patient is in bed, sleepy but comfortable. ?   ?Objective:  ?Blood pressure 130/90, pulse 69, temperature 97.9 ?F (36.6 ?C), resp. rate 18, SpO2 96 %, unknown if currently breastfeeding. ?Gen: NAD ?HENT: Normocephalic, atraumatic ?Lungs: Normal respiratory effort ?Heart: Regular rate noted ?Abdomen: NT, firm fundus, soft ?Pelvic (RN present as chaperone): About 50 ml clot expressed from vagina during fundal massage. Slow trickle of blood noted. ?Ext: 2+ DTRs, no edema, no cyanosis, negative Homan's sign ? ?Assessment & Plan:  ?26 y.o. [id number] s/p D&E for retained products of conception and hemorrhage after recent delivery. ?- Will give another dose of Methergine 0.2 mg IM now, continue to monitor closely ?- If bleeding is still significant, may need further intervention (such as JADA, foley bulb in uterus) ?- Continue close observation in PACU for now. ? ? ?Verita Schneiders, MD, FACOG ?Obstetrician Social research officer, government, Faculty Practice ?Center for [place] ? ? ? ? ?

## 2022-04-03 NOTE — Anesthesia Preprocedure Evaluation (Signed)
Anesthesia Evaluation  ?Patient identified by MRN, date of birth, ID band ?Patient awake ? ? ? ?Reviewed: ?Allergy & Precautions, H&P , NPO status , Patient's Chart, lab work & pertinent test results, reviewed documented beta blocker date and time  ? ?Airway ?Mallampati: I ? ?TM Distance: >3 FB ?Neck ROM: full ? ? ? Dental ?no notable dental hx. ?(+) Teeth Intact, Dental Advisory Given ?  ?Pulmonary ?neg pulmonary ROS, Current Smoker and Patient abstained from smoking.,  ?  ?Pulmonary exam normal ?breath sounds clear to auscultation ? ? ? ? ? ? Cardiovascular ?negative cardio ROS ?Normal cardiovascular exam ?Rhythm:regular Rate:Normal ? ? ?  ?Neuro/Psych ?negative neurological ROS ? negative psych ROS  ? GI/Hepatic ?Neg liver ROS, GERD  ,  ?Endo/Other  ?negative endocrine ROS ? Renal/GU ?negative Renal ROS  ?negative genitourinary ?  ?Musculoskeletal ? ? Abdominal ?  ?Peds ? Hematology ?negative hematology ROS ?(+)   ?Anesthesia Other Findings ? ? Reproductive/Obstetrics ? ?  ? ? ? ? ? ? ? ? ? ? ? ? ? ?  ?  ? ? ? ? ? ? ? ? ?Anesthesia Physical ? ?Anesthesia Plan ? ?ASA: 2 and emergent ? ?Anesthesia Plan: General  ? ?Post-op Pain Management: Minimal or no pain anticipated  ? ?Induction: Intravenous ? ?PONV Risk Score and Plan: 2 and Treatment may vary due to age or medical condition, Ondansetron and Midazolam ? ?Airway Management Planned: LMA ? ?Additional Equipment: None ? ?Intra-op Plan:  ? ?Post-operative Plan: Extubation in OR ? ?Informed Consent: I have reviewed the patients History and Physical, chart, labs and discussed the procedure including the risks, benefits and alternatives for the proposed anesthesia with the patient or authorized representative who has indicated his/her understanding and acceptance.  ? ? ? ? ? ?Plan Discussed with: Anesthesiologist and CRNA ? ?Anesthesia Plan Comments:   ? ? ? ? ? ? ?Anesthesia Quick Evaluation ? ?

## 2022-04-04 ENCOUNTER — Encounter (HOSPITAL_COMMUNITY): Payer: Self-pay | Admitting: Obstetrics & Gynecology

## 2022-04-04 LAB — SURGICAL PATHOLOGY

## 2022-04-07 ENCOUNTER — Telehealth: Payer: Self-pay | Admitting: *Deleted

## 2022-04-07 ENCOUNTER — Telehealth (HOSPITAL_COMMUNITY): Payer: Self-pay | Admitting: *Deleted

## 2022-04-07 NOTE — Telephone Encounter (Signed)
Left patient an urgent message to call and schedule 2 week Post Op appointment with an MD only. ?

## 2022-04-07 NOTE — Telephone Encounter (Signed)
Mom reports feeling good. No concerns about herself at this time. EPDS= 8(Hospital score=6) ?Mom reports baby is doing well. Feeding, peeing, and pooping without difficulty. Safe sleep reviewed. Mom reports no concerns about baby at present. ? ?Duffy Rhody, RN 04-07-2022 at 12:13pm ?

## 2022-04-14 ENCOUNTER — Ambulatory Visit: Payer: Medicaid Other | Admitting: Sports Medicine

## 2022-04-14 HISTORY — PX: DILATION AND CURETTAGE OF UTERUS: SHX78

## 2022-04-18 ENCOUNTER — Ambulatory Visit: Payer: Medicaid Other | Admitting: Sports Medicine

## 2022-04-24 ENCOUNTER — Ambulatory Visit (INDEPENDENT_AMBULATORY_CARE_PROVIDER_SITE_OTHER): Payer: Medicaid Other | Admitting: Obstetrics and Gynecology

## 2022-04-24 ENCOUNTER — Encounter: Payer: Self-pay | Admitting: Obstetrics and Gynecology

## 2022-04-24 VITALS — BP 129/74 | HR 85 | Ht 60.0 in | Wt 116.0 lb

## 2022-04-24 DIAGNOSIS — Z09 Encounter for follow-up examination after completed treatment for conditions other than malignant neoplasm: Secondary | ICD-10-CM

## 2022-04-24 DIAGNOSIS — Z3009 Encounter for other general counseling and advice on contraception: Secondary | ICD-10-CM | POA: Diagnosis not present

## 2022-04-24 NOTE — Progress Notes (Signed)
GYNECOLOGY OFFICE VISIT NOTE  History:   Kelly Allen is a 26 y.o. [phone] here today for follow up from [facility] for RPOC. She had surgery on 5/4. She no longer has any bleeding.    I asked her about birth control. She would also like to have her tubes tied. She signed tubal papers on 5/1.      Past Medical History:  Diagnosis Date   Acid reflux    IBS (irritable bowel syndrome)     Past Surgical History:  Procedure Laterality Date   CHOLECYSTECTOMY     DILATION AND CURETTAGE OF UTERUS N/A 04/03/2022   Procedure: SUCTION DILATATION AND CURETTAGE;  Surgeon: Tereso Newcomer, MD;  Location: [facility] OR;  Service: Gynecology;  Laterality: N/A;   WISDOM TOOTH EXTRACTION      The following portions of the patient's history were reviewed and updated as appropriate: allergies, current medications, past family history, past medical history, past social history, past surgical history and problem list.   Health Maintenance:   Diagnosis  Date Value Ref Range Status  01/14/2022 (A)  Final   - Atypical squamous cells of undetermined significance (ASC-US)   HPV negative.   Review of Systems:  Pertinent items noted in HPI and remainder of comprehensive ROS otherwise negative.  Physical Exam:  BP 129/74   Pulse 85   Ht 5' (1.524 m)   Wt 116 lb (52.6 kg)   BMI 22.65 kg/m  CONSTITUTIONAL: Well-developed, well-nourished female in no acute distress.  HEENT:  Normocephalic, atraumatic. External right and left ear normal. No scleral icterus.  NECK: Normal range of motion, supple, no masses noted on observation SKIN: No rash noted. Not diaphoretic. No erythema. No pallor. MUSCULOSKELETAL: Normal range of motion. No edema noted. NEUROLOGIC: Alert and oriented to person, place, and time. Normal muscle tone coordination. No cranial nerve deficit noted. PSYCHIATRIC: Normal mood and affect. Normal behavior. Normal judgment and thought content.  CARDIOVASCULAR: Normal heart rate noted RESPIRATORY:  Effort and breath sounds normal, no problems with respiration noted ABDOMEN: No masses noted. No other overt distention noted.    PELVIC: Deferred  Labs and Imaging No results found for this or any previous visit (from the past 168 hour(s)). US PELVIS (TRANSABDOMINAL ONLY)  Result Date: 04/03/2022 CLINICAL DATA:  Abdominal pain.  Vaginal delivery on 03/29/2022. EXAM: TRANSABDOMINAL ULTRASOUND OF PELVIS TECHNIQUE: Transabdominal ultrasound examination of the pelvis was performed including evaluation of the uterus, ovaries, adnexal regions, and pelvic cul-de-sac. COMPARISON:  None Available. FINDINGS: Uterus Measurements: 13.6 x 7.2 x 8.0 cm = volume: 408 mL. No fibroids or other mass visualized. Endometrium Thickness: 4.6 cm. Endometrium is heterogeneous and thickened containing a large amount of internal vascularity along the posterior fundal region. Right ovary Measurements: 1.9 x 1.4 x 1.8 cm = volume: 2.5 mL. Normal appearance/no adnexal mass. Left ovary Measurements: 2.8 x 1.8 x 2.3 cm = volume: 6.2 mL. Normal appearance/no adnexal mass. Other findings:  No abnormal free fluid. IMPRESSION: 1. The endometrium is heterogeneous and thickened. There is a large amount of internal vascularity along the posterior fundal portion. Findings are concerning for retained products of conception. Arteriovenous malformation not excluded given this appearance. Electronically Signed   By: Darliss Cheney M.D.   On: 04/03/2022 00:48    Assessment and Plan:   1. Postop check She is fully recovered from the surgery. She has no concerns or questions.   2. Unwanted fertility - She desires permanent sterilization. Discussed alternatives including LARC options (  we discussed nexplanon, cu-iud, lng-iud) and vasectomy. She declines these options.  - Discussed surgery of salpingectomy vs tubal ligation. She would like to do a salpingectomy.  - Discussed risks: bleeding, infection, injury to surrounding organs/tissues, possible  need for open surgery, risk of regret. - We discussed the very permanent nature of salpingectomy and reviewed possible scenarios where many women change their minds. We discussed the only way to be pregnant following the procedure would be IVF. She and her partner both feel if they would want any more children, they would adopt or foster.  - Reviewed restrictions and recovery following surgery - She reports she is 100% sure and no situation in which she will change her mind. and would like to have the surgery done.  - Message sent to [place] to schedule.    Routine preventative health maintenance measures emphasized. Please refer to After Visit Summary for other counseling recommendations.   No follow-ups on file.  Milas Hock, MD, FACOG Obstetrician & Gynecologist, [facility] for [facility], [facility]

## 2022-05-13 ENCOUNTER — Ambulatory Visit: Payer: Medicaid Other | Admitting: Advanced Practice Midwife

## 2022-05-20 ENCOUNTER — Ambulatory Visit: Payer: Medicaid Other

## 2022-05-27 ENCOUNTER — Ambulatory Visit (INDEPENDENT_AMBULATORY_CARE_PROVIDER_SITE_OTHER): Payer: Medicaid Other | Admitting: Obstetrics and Gynecology

## 2022-05-27 ENCOUNTER — Encounter: Payer: Self-pay | Admitting: Obstetrics and Gynecology

## 2022-05-27 VITALS — BP 135/76 | HR 84 | Resp 16 | Ht 60.0 in | Wt 116.0 lb

## 2022-05-27 DIAGNOSIS — R519 Headache, unspecified: Secondary | ICD-10-CM

## 2022-05-27 DIAGNOSIS — O99345 Other mental disorders complicating the puerperium: Secondary | ICD-10-CM

## 2022-05-27 DIAGNOSIS — F418 Other specified anxiety disorders: Secondary | ICD-10-CM

## 2022-05-27 MED ORDER — HYDROXYZINE PAMOATE 25 MG PO CAPS: 25.0000 mg | ORAL_CAPSULE | Freq: Three times a day (TID) | ORAL | 1 refills | Status: AC | PRN

## 2022-05-27 MED ORDER — SERTRALINE HCL 50 MG PO TABS: 25.0000 mg | ORAL_TABLET | Freq: Every day | ORAL | 2 refills | Status: DC

## 2022-05-27 NOTE — Progress Notes (Signed)
Salpingectomy scheduled in Aug Post Partum Exam  Kelly Allen is a 26 y.o. [phone] female who presents for a postpartum visit. She is 8 weeks postpartum following a spontaneous vaginal delivery. I have fully reviewed the prenatal and intrapartum course. The delivery was at [redacted]w[redacted]d gestational weeks.  Anesthesia: epidural. Postpartum course has been remarkable with having D and C for retained POC, then emergency surgery at [facility] for Delayed postpartum hemorrhage.   Baby's course has been unremarkable. Baby is feeding by breast. Bleeding no bleeding. Bowel function is normal. Bladder function is normal. Patient is sexually active. Contraception method is condoms. Postpartum depression screening:positive.  She reports PTSD and worry since having emergency surgery. She see's visuals of heavy bleeding that occurred prior to her hemorrhage. She has not been able to get these visuals out of her head. This is causing a lot of stress and anxiety. The worry is keeping her awake at night. She has stopped bleeding since her surgery. She has no thoughts of harm to herself or others. She is interested in starting medication to help with her worry.   She reports Daily headache's sine emergency surgery.  No history of headaches. Sometimes she see's spots in her vision. She has Tried ibuprofen and tylenol and nothing helps. Feels like she is eating well during the day. She is staying well hydrated. Has a lot of PTSD from delivery which is contributing to her headaches. Denies chest pain or SOB. Denies changes in her mobility. She has good support at home from partner.   The following portions of the patient's history were reviewed and updated as appropriate: allergies, current medications, past family history, past medical history, past social history, past surgical history, and problem list.  Review of Systems Pertinent items are noted in HPI.    Objective:    BP 116/78 mmHg  Pulse 78  Resp 16  Ht 5\' 5"  (1.651  m)  Wt 211 lb (95.709 kg)  BMI 35.11 kg/m2  Breastfeeding? Yes  General:  alert, cooperative, and no distress  Lungs: clear to auscultation bilaterally  Heart:  regular rate and rhythm, S1, S2 normal, no murmur, click, rub or gallop  Abdomen: soft, non-tender; bowel sounds normal; no masses,  no organomegaly        Assessment:   Postpartum, status post vaginal delivery 8 weeks ago. Status post D &C for retained placenta, then emergency D& C for delayed postpartum hemorrhage at [facility].   Plan:   1. Contraception:  She is scheduled for tubal ligation in Aug. Condoms until then.  2. Worry/anxiety: Start Zoloft 25 mg, increase to 50 mg after 5 days. The goal/ therapeutic dose is 100 mg. Follow up visit with [place] in 2 weeks. Ambulatory referral to integrated health. Get outside every single day. Practice breathing techniques.  3. Headaches: likely stress related. CBC today to check for anemia. OTC Excedrin with Vistaril- RX. If no improvement will send referral to neurology. 4. Abnormal pap 2023: Repeat pap 01/2023   Venia Carbon I, NP 05/27/2022 4:48 PM

## 2022-06-09 NOTE — Progress Notes (Deleted)
   GYNECOLOGY OFFICE VISIT NOTE  History:   Kelly Allen is a 26 y.o. [phone] here today for follow up for postpartum anxiety. She was prescribed Zoloft - ***. She feels ***.   She denies any abnormal vaginal discharge, bleeding, pelvic pain or other concerns.     Past Medical History:  Diagnosis Date   Acid reflux    IBS (irritable bowel syndrome)     Past Surgical History:  Procedure Laterality Date   CHOLECYSTECTOMY     DILATION AND CURETTAGE OF UTERUS N/A 04/03/2022   Procedure: SUCTION DILATATION AND CURETTAGE;  Surgeon: Tereso Newcomer, MD;  Location: [facility] OR;  Service: Gynecology;  Laterality: N/A;   WISDOM TOOTH EXTRACTION      The following portions of the patient's history were reviewed and updated as appropriate: allergies, current medications, past family history, past medical history, past social history, past surgical history and problem list.   Health Maintenance:   Diagnosis  Date Value Ref Range Status  01/14/2022 (A)  Final   - Atypical squamous cells of undetermined significance (ASC-US)  HPV negative  Review of Systems:  Pertinent items noted in HPI and remainder of comprehensive ROS otherwise negative.  Physical Exam:  There were no vitals taken for this visit. CONSTITUTIONAL: Well-developed, well-nourished female in no acute distress.  HEENT:  Normocephalic, atraumatic. External right and left ear normal. No scleral icterus.  NECK: Normal range of motion, supple, no masses noted on observation SKIN: No rash noted. Not diaphoretic. No erythema. No pallor. MUSCULOSKELETAL: Normal range of motion. No edema noted. NEUROLOGIC: Alert and oriented to person, place, and time. Normal muscle tone coordination. No cranial nerve deficit noted. PSYCHIATRIC: Normal mood and affect. Normal behavior. Normal judgment and thought content.  CARDIOVASCULAR: Normal heart rate noted RESPIRATORY: Effort and breath sounds normal, no problems with respiration  noted ABDOMEN: No masses noted. No other overt distention noted.    PELVIC: Deferred  Labs and Imaging No results found for this or any previous visit (from the past 168 hour(s)). No results found.  Assessment and Plan:   1. Postpartum anxiety Zoloft currently at *** Has appt with [facility] today after this appt  2. Other iron deficiency anemia Check CBC    Diagnoses and all orders for this visit:  Postpartum anxiety  Other iron deficiency anemia    Routine preventative health maintenance measures emphasized. Please refer to After Visit Summary for other counseling recommendations.   No follow-ups on file.  Milas Hock, MD, FACOG Obstetrician & Gynecologist, [facility] for [facility], [facility]

## 2022-06-12 ENCOUNTER — Encounter: Payer: Medicaid Other | Admitting: Licensed Clinical Social Worker

## 2022-06-12 ENCOUNTER — Ambulatory Visit: Payer: Medicaid Other | Admitting: Obstetrics and Gynecology

## 2022-06-12 ENCOUNTER — Telehealth: Payer: Self-pay | Admitting: Licensed Clinical Social Worker

## 2022-06-12 DIAGNOSIS — D508 Other iron deficiency anemias: Secondary | ICD-10-CM

## 2022-06-12 DIAGNOSIS — F418 Other specified anxiety disorders: Secondary | ICD-10-CM

## 2022-06-12 NOTE — Telephone Encounter (Signed)
Called pt regarding scheduled mychart visit. Left message requesting callback  

## 2022-06-19 ENCOUNTER — Encounter (HOSPITAL_BASED_OUTPATIENT_CLINIC_OR_DEPARTMENT_OTHER): Payer: Self-pay | Admitting: Obstetrics and Gynecology

## 2022-06-19 NOTE — Progress Notes (Signed)
Spoke w/ via phone for pre-op interview--- patient Lab needs dos----  POCT urine pregnancy; already has MD order for T&S             Lab results------ in EPIC COVID test -----patient states asymptomatic no test needed Arrive at ------- 0840 on Tuesday 07/01/2022 NPO after MN NO Solid Food.  Clear liquids from MN until--- 0740 on 07/01/22 Med rec completed Medications to take morning of surgery ----- vistaril Diabetic medication ----- n/a Patient instructed no nail polish to be worn day of surgery Patient instructed to bring photo id and insurance card day of surgery Patient aware to have Driver (ride ) / caregiver    for 24 hours after surgery - Karlin Binion (mother) [phone] Patient Special Instructions ----- n/a Pre-Op special Istructions ----- has MD order for ERAS ensure drink Patient verbalized understanding of instructions that were given at this phone interview. Patient denies shortness of breath, chest pain, fever, cough at this phone interview.  Frances Furbish, RN

## 2022-06-26 ENCOUNTER — Ambulatory Visit (INDEPENDENT_AMBULATORY_CARE_PROVIDER_SITE_OTHER): Payer: Medicaid Other | Admitting: Licensed Clinical Social Worker

## 2022-06-26 DIAGNOSIS — F411 Generalized anxiety disorder: Secondary | ICD-10-CM

## 2022-06-30 NOTE — BH Specialist Note (Signed)
Integrated Behavioral Health via Telemedicine Visit  06/30/2022 De Nurse [phone]  Number of Integrated Behavioral Health Clinician visits: 2 Session Start time: 1:53pm  Session End time: 2:20pm Total time in minutes: 26 mins via mychart video   Referring Provider: self referral  Patient/Family location: Home  [facility] Provider location: [facility]  All persons participating in visit: Pt A Favata and LCSW A. Alece Koppel  Types of Service: Individual psychotherapy and video visit   I connected with De Nurse and/or [employer] n/a via  Telephone or Engineer, civil (consulting)  (Video is Surveyor, mining) and verified that I am speaking with the correct person using two identifiers. Discussed confidentiality: Yes   I discussed the limitations of telemedicine and the availability of in person appointments.  Discussed there is a possibility of technology failure and discussed alternative modes of communication if that failure occurs.  I discussed that engaging in this telemedicine visit, they consent to the provision of behavioral healthcare and the services will be billed under their insurance.  Patient and/or legal guardian expressed understanding and consented to Telemedicine visit: Yes   Presenting Concerns: Patient and/or family reports the following symptoms/concerns: anxious mood  Duration of problem: approx one year ; Severity of problem: mild  Patient and/or Family's Strengths/Protective Factors: Concrete supports in place (healthy food, safe environments, etc.)  Goals Addressed: Patient will:  Reduce symptoms of: anxiety   Increase knowledge and/or ability of: coping skills and stress reduction   Demonstrate ability to: Increase healthy adjustment to current life circumstances  Progress towards Goals: Ongoing  Interventions: Interventions utilized:  Supportive Counseling Standardized Assessments completed: Not Needed  Patient and/or Family  Response: Ms. Fulfer reports anxious mood regarding pending medical procedure. Ms. Wettstein reports he labor was traumatic for her and the sight of blood is an emotional trigger. Ms. Borowski reports boyfriend is supportive. Ms. Schoenfelder reports being worried, crying, difficulty sleeping.   Assessment: Patient currently experiencing anxiety.   Patient may benefit from integrated behavioral health.  Plan: Follow up with behavioral health clinician on : 3 weeks  Behavioral recommendations: discuss surgery concerns with medical provider, prepare a list of questions to ask, prioritize rest,  Referral(s): Integrated [employer] (In Clinic)  I discussed the assessment and treatment plan with the patient and/or parent/guardian. They were provided an opportunity to ask questions and all were answered. They agreed with the plan and demonstrated an understanding of the instructions.   They were advised to call back or seek an in-person evaluation if the symptoms worsen or if the condition fails to improve as anticipated.  Gwyndolyn Saxon, LCSW

## 2022-07-01 ENCOUNTER — Ambulatory Visit (HOSPITAL_BASED_OUTPATIENT_CLINIC_OR_DEPARTMENT_OTHER): Payer: Medicaid Other | Admitting: Anesthesiology

## 2022-07-01 ENCOUNTER — Encounter (HOSPITAL_BASED_OUTPATIENT_CLINIC_OR_DEPARTMENT_OTHER): Payer: Self-pay | Admitting: Obstetrics and Gynecology

## 2022-07-01 ENCOUNTER — Ambulatory Visit (HOSPITAL_BASED_OUTPATIENT_CLINIC_OR_DEPARTMENT_OTHER)
Admission: RE | Admit: 2022-07-01 | Discharge: 2022-07-01 | Disposition: A | Discharge status: Home / Self Care | Payer: Medicaid Other | Attending: Obstetrics and Gynecology | Admitting: Obstetrics and Gynecology

## 2022-07-01 ENCOUNTER — Other Ambulatory Visit: Payer: Self-pay

## 2022-07-01 ENCOUNTER — Encounter (HOSPITAL_BASED_OUTPATIENT_CLINIC_OR_DEPARTMENT_OTHER)
Admission: RE | Disposition: A | Discharge status: Home / Self Care | Payer: Self-pay | Source: Home / Self Care | Attending: Obstetrics and Gynecology

## 2022-07-01 DIAGNOSIS — Z01818 Encounter for other preprocedural examination: Secondary | ICD-10-CM

## 2022-07-01 DIAGNOSIS — Z302 Encounter for sterilization: Secondary | ICD-10-CM

## 2022-07-01 DIAGNOSIS — F17210 Nicotine dependence, cigarettes, uncomplicated: Secondary | ICD-10-CM | POA: Diagnosis not present

## 2022-07-01 DIAGNOSIS — Z3009 Encounter for other general counseling and advice on contraception: Secondary | ICD-10-CM

## 2022-07-01 HISTORY — DX: Anxiety disorder, unspecified: F41.9

## 2022-07-01 HISTORY — DX: Irritable bowel syndrome, unspecified: K58.9

## 2022-07-01 HISTORY — DX: Depression, unspecified: F32.A

## 2022-07-01 HISTORY — PX: LAPAROSCOPIC BILATERAL SALPINGECTOMY: SHX5889

## 2022-07-01 LAB — TYPE AND SCREEN
ABO/RH(D): O POS
Antibody Screen: NEGATIVE

## 2022-07-01 LAB — POCT PREGNANCY, URINE: Preg Test, Ur: NEGATIVE

## 2022-07-01 SURGERY — SALPINGECTOMY, BILATERAL, LAPAROSCOPIC
Anesthesia: General | Site: Abdomen | Laterality: Bilateral

## 2022-07-01 MED ORDER — ONDANSETRON HCL 4 MG/2ML IJ SOLN
INTRAMUSCULAR | Status: AC
  Filled 2022-07-01: qty 2

## 2022-07-01 MED ORDER — OXYCODONE HCL 5 MG PO TABS: 5.0000 mg | ORAL_TABLET | ORAL | 0 refills | Status: AC | PRN

## 2022-07-01 MED ORDER — ACETAMINOPHEN 500 MG PO TABS
ORAL_TABLET | ORAL | Status: AC
  Filled 2022-07-01: qty 2

## 2022-07-01 MED ORDER — ACETAMINOPHEN 500 MG PO TABS
1000.0000 mg | ORAL_TABLET | Freq: Once | ORAL | Status: AC
Start: 2022-07-01 — End: 2022-07-01
  Administered 2022-07-01: 1000 mg via ORAL

## 2022-07-01 MED ORDER — ROCURONIUM BROMIDE 10 MG/ML (PF) SYRINGE
PREFILLED_SYRINGE | INTRAVENOUS | Status: AC
  Filled 2022-07-01: qty 10

## 2022-07-01 MED ORDER — OXYCODONE HCL 5 MG PO TABS
5.0000 mg | ORAL_TABLET | Freq: Once | ORAL | Status: AC | PRN
  Administered 2022-07-01: 5 mg via ORAL

## 2022-07-01 MED ORDER — PROPOFOL 10 MG/ML IV BOLUS
INTRAVENOUS | Status: AC
  Filled 2022-07-01: qty 20

## 2022-07-01 MED ORDER — MIDAZOLAM HCL 2 MG/2ML IJ SOLN
INTRAMUSCULAR | Status: AC
  Filled 2022-07-01: qty 2

## 2022-07-01 MED ORDER — KETOROLAC TROMETHAMINE 30 MG/ML IJ SOLN
INTRAMUSCULAR | Status: DC | PRN
  Administered 2022-07-01: 30 mg via INTRAVENOUS

## 2022-07-01 MED ORDER — FENTANYL CITRATE (PF) 100 MCG/2ML IJ SOLN
INTRAMUSCULAR | Status: AC
  Filled 2022-07-01: qty 2

## 2022-07-01 MED ORDER — PROPOFOL 10 MG/ML IV BOLUS
INTRAVENOUS | Status: DC | PRN
  Administered 2022-07-01: 50 mg via INTRAVENOUS
  Administered 2022-07-01: 150 mg via INTRAVENOUS

## 2022-07-01 MED ORDER — POVIDONE-IODINE 10 % EX SWAB: 2.0000 | Freq: Once | CUTANEOUS | Status: DC

## 2022-07-01 MED ORDER — KETOROLAC TROMETHAMINE 30 MG/ML IJ SOLN
INTRAMUSCULAR | Status: AC
  Filled 2022-07-01: qty 1

## 2022-07-01 MED ORDER — LIDOCAINE 2% (20 MG/ML) 5 ML SYRINGE
INTRAMUSCULAR | Status: DC | PRN
  Administered 2022-07-01: 40 mg via INTRAVENOUS

## 2022-07-01 MED ORDER — 0.9 % SODIUM CHLORIDE (POUR BTL) OPTIME
TOPICAL | Status: DC | PRN
  Administered 2022-07-01: 500 mL

## 2022-07-01 MED ORDER — KETOROLAC TROMETHAMINE 15 MG/ML IJ SOLN: 15.0000 mg | INTRAMUSCULAR | Status: DC

## 2022-07-01 MED ORDER — OXYCODONE HCL 5 MG PO TABS
ORAL_TABLET | ORAL | Status: AC
  Filled 2022-07-01: qty 1

## 2022-07-01 MED ORDER — FENTANYL CITRATE (PF) 100 MCG/2ML IJ SOLN
INTRAMUSCULAR | Status: DC | PRN
Start: 2022-07-01 — End: 2022-07-01
  Administered 2022-07-01 (×2): 50 ug via INTRAVENOUS

## 2022-07-01 MED ORDER — ACETAMINOPHEN 500 MG PO TABS: 1000.0000 mg | ORAL_TABLET | ORAL | Status: DC

## 2022-07-01 MED ORDER — BUPIVACAINE HCL (PF) 0.25 % IJ SOLN
INTRAMUSCULAR | Status: DC | PRN
  Administered 2022-07-01: 10 mL

## 2022-07-01 MED ORDER — ROCURONIUM BROMIDE 10 MG/ML (PF) SYRINGE
PREFILLED_SYRINGE | INTRAVENOUS | Status: DC | PRN
  Administered 2022-07-01: 50 mg via INTRAVENOUS

## 2022-07-01 MED ORDER — DEXAMETHASONE SODIUM PHOSPHATE 10 MG/ML IJ SOLN
INTRAMUSCULAR | Status: AC
  Filled 2022-07-01: qty 1

## 2022-07-01 MED ORDER — MIDAZOLAM HCL 5 MG/5ML IJ SOLN
INTRAMUSCULAR | Status: DC | PRN
  Administered 2022-07-01: 2 mg via INTRAVENOUS

## 2022-07-01 MED ORDER — ONDANSETRON HCL 4 MG/2ML IJ SOLN
INTRAMUSCULAR | Status: DC | PRN
  Administered 2022-07-01: 4 mg via INTRAVENOUS

## 2022-07-01 MED ORDER — LACTATED RINGERS IV SOLN: INTRAVENOUS | Status: DC

## 2022-07-01 MED ORDER — FENTANYL CITRATE (PF) 100 MCG/2ML IJ SOLN
25.0000 ug | INTRAMUSCULAR | Status: DC | PRN
  Administered 2022-07-01: 25 ug via INTRAVENOUS

## 2022-07-01 MED ORDER — DEXAMETHASONE SODIUM PHOSPHATE 10 MG/ML IJ SOLN
INTRAMUSCULAR | Status: DC | PRN
  Administered 2022-07-01: 10 mg via INTRAVENOUS

## 2022-07-01 MED ORDER — SUGAMMADEX SODIUM 200 MG/2ML IV SOLN
INTRAVENOUS | Status: DC | PRN
  Administered 2022-07-01: 200 mg via INTRAVENOUS

## 2022-07-01 MED ORDER — IBUPROFEN 800 MG PO TABS: 800.0000 mg | ORAL_TABLET | Freq: Three times a day (TID) | ORAL | 0 refills | Status: AC | PRN

## 2022-07-01 SURGICAL SUPPLY — 28 items
ADH SKN CLS APL DERMABOND .7 (GAUZE/BANDAGES/DRESSINGS) ×1
CATH ROBINSON RED A/P 16FR (CATHETERS) ×1 IMPLANT
DERMABOND ADVANCED (GAUZE/BANDAGES/DRESSINGS) ×1
DERMABOND ADVANCED .7 DNX12 (GAUZE/BANDAGES/DRESSINGS) ×1 IMPLANT
DURAPREP 26ML APPLICATOR (WOUND CARE) ×2 IMPLANT
GLOVE BIO SURGEON STRL SZ 6 (GLOVE) ×2 IMPLANT
GLOVE BIOGEL PI IND STRL 7.0 (GLOVE) ×4 IMPLANT
GLOVE BIOGEL PI INDICATOR 7.0 (GLOVE) ×4
GLOVE ECLIPSE 6.5 STRL STRAW (GLOVE) ×2 IMPLANT
GOWN STRL REUS W/ TWL LRG LVL3 (GOWN DISPOSABLE) ×1 IMPLANT
GOWN STRL REUS W/TWL LRG LVL3 (GOWN DISPOSABLE) ×2
IRRIG SUCT STRYKERFLOW 2 WTIP (MISCELLANEOUS)
IRRIGATION SUCT STRKRFLW 2 WTP (MISCELLANEOUS) IMPLANT
KIT TURNOVER CYSTO (KITS) ×2 IMPLANT
NEEDLE INSUFFLATION 120MM (ENDOMECHANICALS) ×2 IMPLANT
NS IRRIG 500ML POUR BTL (IV SOLUTION) ×1 IMPLANT
PACK LAPAROSCOPY BASIN (CUSTOM PROCEDURE TRAY) ×2 IMPLANT
PACK TRENDGUARD 450 HYBRID PRO (MISCELLANEOUS) ×1 IMPLANT
SEALER TISSUE G2 CVD JAW 35 (ENDOMECHANICALS) ×1 IMPLANT
SEALER TISSUE G2 CVD JAW 45CM (ENDOMECHANICALS) ×1
SUT VICRYL 4-0 PS2 18IN ABS (SUTURE) ×2 IMPLANT
TOWEL OR 17X26 10 PK STRL BLUE (TOWEL DISPOSABLE) ×2 IMPLANT
TRENDGUARD 450 HYBRID PRO PACK (MISCELLANEOUS) ×2
TROCAR KII 8X100ML NONTHREADED (TROCAR) IMPLANT
TROCAR XCEL NON BLADE 8MM B8LT (ENDOMECHANICALS) ×2 IMPLANT
TROCAR XCEL NON-BLD 5MMX100MML (ENDOMECHANICALS) ×1 IMPLANT
TROCAR Z-THREAD FIOS 5X100MM (TROCAR) ×4 IMPLANT
WARMER LAPAROSCOPE (MISCELLANEOUS) ×2 IMPLANT

## 2022-07-01 NOTE — Anesthesia Preprocedure Evaluation (Addendum)
Anesthesia Evaluation  Patient identified by MRN, date of birth, ID band Patient awake    Reviewed: Allergy & Precautions, NPO status , Patient's Chart, lab work & pertinent test results  History of Anesthesia Complications (+) PONV and history of anesthetic complications  Airway Mallampati: I  TM Distance: >3 FB Neck ROM: Full    Dental no notable dental hx. (+) Teeth Intact, Dental Advisory Given   Pulmonary neg pulmonary ROS, Current Smoker and Patient abstained from smoking.,    Pulmonary exam normal breath sounds clear to auscultation       Cardiovascular negative cardio ROS Normal cardiovascular exam Rhythm:Regular Rate:Normal     Neuro/Psych PSYCHIATRIC DISORDERS Anxiety Depression negative neurological ROS     GI/Hepatic Neg liver ROS, GERD  ,  Endo/Other  negative endocrine ROS  Renal/GU negative Renal ROS  negative genitourinary   Musculoskeletal negative musculoskeletal ROS (+)   Abdominal   Peds  Hematology negative hematology ROS (+)   Anesthesia Other Findings   Reproductive/Obstetrics                            Anesthesia Physical Anesthesia Plan  ASA: 2  Anesthesia Plan: General   Post-op Pain Management: Tylenol PO (pre-op)*   Induction: Intravenous  PONV Risk Score and Plan: 2 and Midazolam, Dexamethasone, Ondansetron and TIVA  Airway Management Planned: Oral ETT  Additional Equipment:   Intra-op Plan:   Post-operative Plan: Extubation in OR  Informed Consent: I have reviewed the patients History and Physical, chart, labs and discussed the procedure including the risks, benefits and alternatives for the proposed anesthesia with the patient or authorized representative who has indicated his/her understanding and acceptance.     Dental advisory given  Plan Discussed with: CRNA  Anesthesia Plan Comments:        Anesthesia Quick Evaluation

## 2022-07-01 NOTE — Discharge Instructions (Addendum)
DO NOT TAKE MOTRIN/IBUPROFEN/ADVIL until after 5:45 today.    Post Anesthesia Home Care Instructions  Activity: Get plenty of rest for the remainder of the day. A responsible adult should stay with you for 24 hours following the procedure.  For the next 24 hours, DO NOT: -Drive a car -Advertising copywriter -Drink alcoholic beverages -Take any medication unless instructed by your physician -Make any legal decisions or sign important papers.  Meals: Start with liquid foods such as gelatin or soup. Progress to regular foods as tolerated. Avoid greasy, spicy, heavy foods. If nausea and/or vomiting occur, drink only clear liquids until the nausea and/or vomiting subsides. Call your physician if vomiting continues.  Special Instructions/Symptoms: Your throat may feel dry or sore from the anesthesia or the breathing tube placed in your throat during surgery. If this causes discomfort, gargle with warm salt water. The discomfort should disappear within 24 hours.  If you had a scopolamine patch placed behind your ear for the management of post- operative nausea and/or vomiting:  1. The medication in the patch is effective for 72 hours, after which it should be removed.  Wrap patch in a tissue and discard in the trash. Wash hands thoroughly with soap and water. 2. You may remove the patch earlier than 72 hours if you experience unpleasant side effects which may include dry mouth, dizziness or visual disturbances. 3. Avoid touching the patch. Wash your hands with soap and water after contact with the patch.

## 2022-07-01 NOTE — Anesthesia Procedure Notes (Signed)
Procedure Name: Intubation Date/Time: 07/01/2022 11:14 AM  Performed by: Rogers Blocker, CRNAPre-anesthesia Checklist: Patient identified, Emergency Drugs available, Suction available and Patient being monitored Patient Re-evaluated:Patient Re-evaluated prior to induction Oxygen Delivery Method: Circle System Utilized Preoxygenation: Pre-oxygenation with 100% oxygen Induction Type: IV induction Ventilation: Mask ventilation without difficulty Laryngoscope Size: Mac and 3 Grade View: Grade I Tube type: Oral Tube size: 7.0 mm Number of attempts: 1 Airway Equipment and Method: Stylet and Bite block Placement Confirmation: ETT inserted through vocal cords under direct vision, positive ETCO2 and breath sounds checked- equal and bilateral Secured at: 22 cm Tube secured with: Tape Dental Injury: Teeth and Oropharynx as per pre-operative assessment

## 2022-07-01 NOTE — Op Note (Addendum)
De Nurse PROCEDURE DATE: 07/01/2022   PREOPERATIVE DIAGNOSIS:  Undesired fertility  POSTOPERATIVE DIAGNOSIS:  Undesired fertility  PROCEDURE:  Laparoscopic Bilateral Salpingectomy   SURGEON:  Dr. Milas Hock  ASSISTANT:  None  ANESTHESIA:  General endotracheal, 0.25% marcaine   COMPLICATIONS:  None immediate.  ESTIMATED BLOOD LOSS:  5 ml.  FLUIDS: 500 ml LR.  URINE OUTPUT:  None, she voided just prior to coming back to the OR  INDICATIONS: 26 y.o. [id number] with undesired fertility, desires permanent sterilization.  Other forms of contraception were discussed with patient and emphasized alternatives of vasectomy, IUDs and Nexplanon as they have equivalent contraceptive efficacy; she declines all other modalities.  Risks of procedure discussed with patient including permanence of method, risk of regret, bleeding, infection, injury to surrounding organs and need for additional procedures including laparotomy.  Failure risk less than 0.5% with increased risk of ectopic gestation if pregnancy occurs was also discussed with patient.  Written informed consent was obtained.    FINDINGS:  Normal uterus, fallopian tubes, and ovaries.  TECHNIQUE:  The patient was taken to the operating room where general anesthesia was obtained without difficulty.  She was then placed in the dorsal supine position and prepared and draped in sterile fashion.  A timeout was performed.      A skin incision was made with the 11 blade scalpel in the umbilicus. I entered her abdominal cavity with a veress needle using closed technique. opening pressure was 2.  Pneumoperitoneum achieved to a pressure of 15.  The 5 mm optiview port was inserted into the abdomen under direct visualization. Below the point of entry and the pelvis was inspected with no evidence of injury.   The scalpel was then used to make two incsions, one in the LLQ and one in the suprapubic area 2 fingerbreadth above the pubic bone. 29mm and 48mm  optiview ports were inserted with the 34mm port being placed suprapubically.  The fallopian tubes were cauterized, cut, and detached from their surrounding pelvic structures with the enseal. No bleeding was noted. The specimens were removed through the 61mm port. The pelvis was inspected and was hemostatic. All ports were then withdrawn and the gas drained from abdomen. They were then closed in a subcuticular fashion with 4-0 vicryl and dressings were placed. Incisions were numbed with 10 cc of 0.25% marcaine.   The patient will be discharged to home as per PACU criteria.  Routine postoperative instructions given.  She will follow up in the office in 4 wks for postoperative evaluation.  Milas Hock, MD Attending Obstetrician & Gynecologist, [facility] for [facility], [facility]

## 2022-07-01 NOTE — Transfer of Care (Signed)
**Note Kelly-Identified via Obfuscation** Immediate Anesthesia Transfer of Care Note  Patient: Kelly Allen  Procedure(s) Performed: LAPAROSCOPIC BILATERAL SALPINGECTOMY (Bilateral: Abdomen)  Patient Location: PACU  Anesthesia Type:General  Level of Consciousness: drowsy, patient cooperative and responds to stimulation  Airway & Oxygen Therapy: Patient Spontanous Breathing and Patient connected to face mask oxygen  Post-op Assessment: Report given to RN and Post -op Vital signs reviewed and stable  Post vital signs: Reviewed and stable  Last Vitals:  Vitals Value Taken Time  BP 99/64 07/01/22 1200  Temp    Pulse 81 07/01/22 1201  Resp 20 07/01/22 1201  SpO2 95 % 07/01/22 1201  Vitals shown include unvalidated device data.  Last Pain:  Vitals:   07/01/22 0923  TempSrc: Oral  PainSc: 0-No pain      Patients Stated Pain Goal: 2 (07/01/22 2111)  Complications: No notable events documented.

## 2022-07-01 NOTE — H&P (Signed)
[facility] Obstetrics and Gynecology Attending History and Physical  Kelly Allen is a 26 y.o. [phone] who presents today for surgery for bilateral salpingectomy for permanent sterilization. She confirms once again today that she does not desire any future fertility under any circumstance.   Past Medical History:  Diagnosis Date   Acid reflux    Anxiety    Depression    IBS (irritable colon syndrome)    IBS-C   Past Surgical History:  Procedure Laterality Date   CHOLECYSTECTOMY     DILATION AND CURETTAGE OF UTERUS N/A 04/03/2022   Procedure: SUCTION DILATATION AND CURETTAGE;  Surgeon: Tereso Newcomer, MD;  Location: [facility] OR;  Service: Gynecology;  Laterality: N/A;   DILATION AND CURETTAGE OF UTERUS  04/14/2022   2nd D&C s/p delivery on 03/29/22   WISDOM TOOTH EXTRACTION     OB History  Gravida Para Term Preterm AB Living  2 2 2     2   SAB IAB Ectopic Multiple Live Births        0 2    # Outcome Date GA Lbr Len/2nd Weight Sex Delivery Anes PTL Lv  2 Term 03/29/22 [redacted]w[redacted]d 02:08 / 00:09 3390 g F Vag-Spont EPI  LIV     Birth Comments: none  1 Term 08/13/19 [redacted]w[redacted]d   F Vag-Spont   LIV  Patient denies any other pertinent gynecologic issues.  No current facility-administered medications on file prior to encounter.   Current Outpatient Medications on File Prior to Encounter  Medication Sig Dispense Refill   ibuprofen (ADVIL) 600 MG tablet Take 1 tablet (600 mg total) by mouth every 6 (six) hours as needed for mild pain or moderate pain. 30 tablet 1   Prenatal Vit-Fe Fumarate-FA (PRENATAL VITAMIN PO) Take by mouth.     No Known Allergies  Social History:   reports that she has been smoking cigarettes. She has a 1.75 pack-year smoking history. She has never used smokeless tobacco. She reports that she does not currently use alcohol. She reports that she does not currently use drugs. Family History  Problem Relation Age of Onset   Diabetes Mother    Asthma Father    COPD Father     Cancer Paternal Grandmother        stomach    Review of Systems: Pertinent items noted in HPI and remainder of comprehensive ROS otherwise negative.  PHYSICAL EXAM: Blood pressure 120/82, pulse 86, temperature 98.1 F (36.7 C), temperature source Oral, resp. rate 15, height 5' (1.524 m), weight 51.7 kg, last menstrual period 05/28/2022, SpO2 100 %, currently breastfeeding. CONSTITUTIONAL: Well-developed, well-nourished female in no acute distress.  HENT:  Normocephalic, atraumatic, External right and left ear normal. Oropharynx is clear and moist EYES: Conjunctivae and EOM are normal. Pupils are equal, round, and reactive to light. No scleral icterus.  NECK: Normal range of motion, supple, no masses SKIN: Skin is warm and dry. No rash noted. Not diaphoretic. No erythema. No pallor. NEUROLOGIC: Alert and oriented to person, place, and time. Normal reflexes, muscle tone coordination. No cranial nerve deficit noted. PSYCHIATRIC: Normal mood and affect. Normal behavior. Normal judgment and thought content. CARDIOVASCULAR: Normal heart rate noted, regular rhythm RESPIRATORY: Effort and breath sounds normal, no problems with respiration noted ABDOMEN: Soft, nontender, nondistended. PELVIC: Deferred MUSCULOSKELETAL: Normal range of motion. No tenderness.  No cyanosis, clubbing, or edema.  2+ distal pulses.  Labs: Results for orders placed or performed during the hospital encounter of 07/01/22 (from the past 336  hour(s))  Pregnancy, urine POC   Collection Time: 07/01/22  9:05 AM  Result Value Ref Range   Preg Test, Ur NEGATIVE NEGATIVE  Type and screen [facility]   Collection Time: 07/01/22  9:46 AM  Result Value Ref Range   ABO/RH(D) PENDING    Antibody Screen PENDING    Sample Expiration      07/04/2022,2359 Performed at [facility], [address], [place], [place] [address]     Imaging Studies: No results found.  Assessment: Active  Problems:   Unwanted fertility   Plan: - She desires permanent sterilization. Discussed alternatives including LARC options (we discussed nexplanon, cu-iud, lng-iud) and vasectomy. She declines these options.  - Discussed surgery of salpingectomy vs tubal ligation. She would like to do a salpingectomy.  - Discussed risks: bleeding, infection, injury to surrounding organs/tissues, possible need for open surgery, risk of regret. - We discussed the very permanent nature of salpingectomy and reviewed possible scenarios where many women change their minds. We discussed the only way to be pregnant following the procedure would be IVF. She and her partner both feel if they would want any more children, they would adopt or foster.  - Reviewed restrictions and recovery following surgery - She reports she is 100% sure and no situation in which she will change her mind. and would like to have the surgery done.    Milas Hock, MD, FACOG Obstetrician & Gynecologist, [facility], [facility]

## 2022-07-02 ENCOUNTER — Encounter (HOSPITAL_BASED_OUTPATIENT_CLINIC_OR_DEPARTMENT_OTHER): Payer: Self-pay | Admitting: Obstetrics and Gynecology

## 2022-07-02 LAB — SURGICAL PATHOLOGY

## 2022-07-02 NOTE — Progress Notes (Signed)
Fentanyl IV wasted with Catie C RN in stericycle.

## 2022-07-02 NOTE — Anesthesia Postprocedure Evaluation (Signed)
Anesthesia Post Note  Patient: Youth worker  Procedure(s) Performed: LAPAROSCOPIC BILATERAL SALPINGECTOMY (Bilateral: Abdomen)     Patient location during evaluation: PACU Anesthesia Type: General Level of consciousness: awake and alert Pain management: pain level controlled Vital Signs Assessment: post-procedure vital signs reviewed and stable Respiratory status: spontaneous breathing, nonlabored ventilation, respiratory function stable and patient connected to nasal cannula oxygen Cardiovascular status: blood pressure returned to baseline and stable Postop Assessment: no apparent nausea or vomiting Anesthetic complications: no   No notable events documented.  Last Vitals:  Vitals:   07/01/22 1230 07/01/22 1342  BP: 103/63 104/72  Pulse: (!) 55 (!) 59  Resp: 20 16  Temp: 36.6 C 36.6 C  SpO2: 98% 99%    Last Pain:  Vitals:   07/01/22 1342  TempSrc:   PainSc: 4                  Adhvik Canady L Dacota Devall

## 2022-07-17 ENCOUNTER — Encounter: Payer: Medicaid Other | Admitting: Licensed Clinical Social Worker

## 2022-12-17 IMAGING — US US BREAST*L* LIMITED INC AXILLA
1 series · 2 of 2 positions shown · non-contrast
Comparison: None.

CLINICAL DATA: 25-year-old pregnant female with palpable thickening
in the UPPER INNER LEFT breast discovered on self-examination.

EXAM:
ULTRASOUND OF THE LEFT BREAST

[Series 1: us breast*left* limited inc axilla · 0.07mm/px · 2 of 2 slices shown]
[im 1/2]
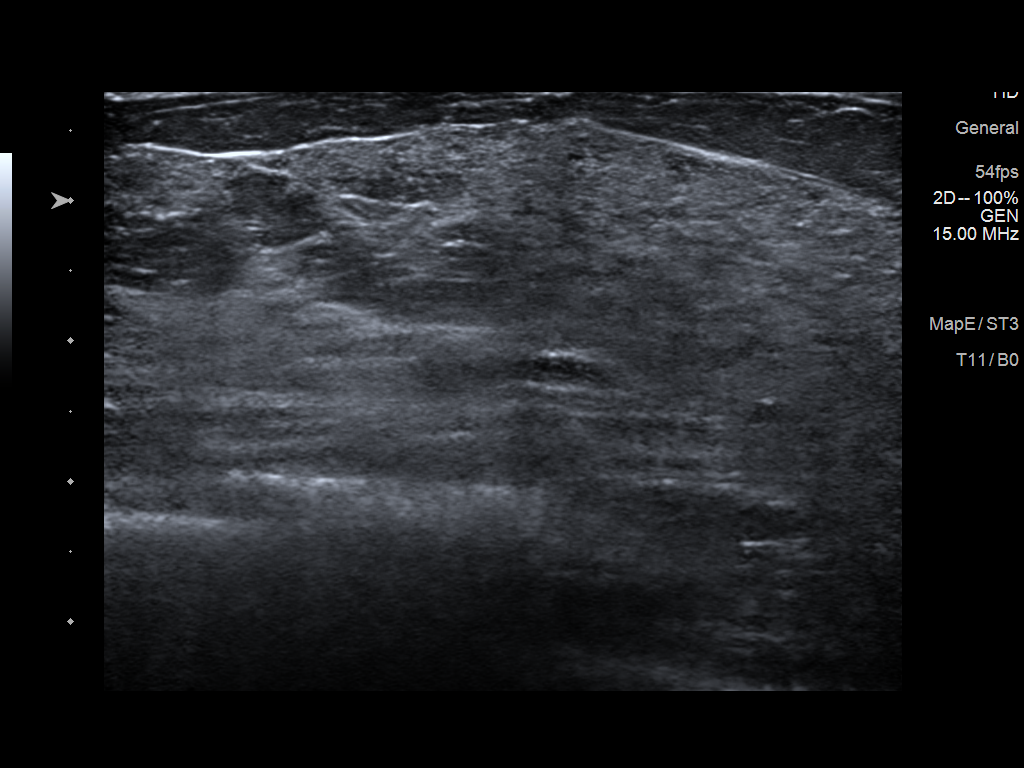
[im 2/2]
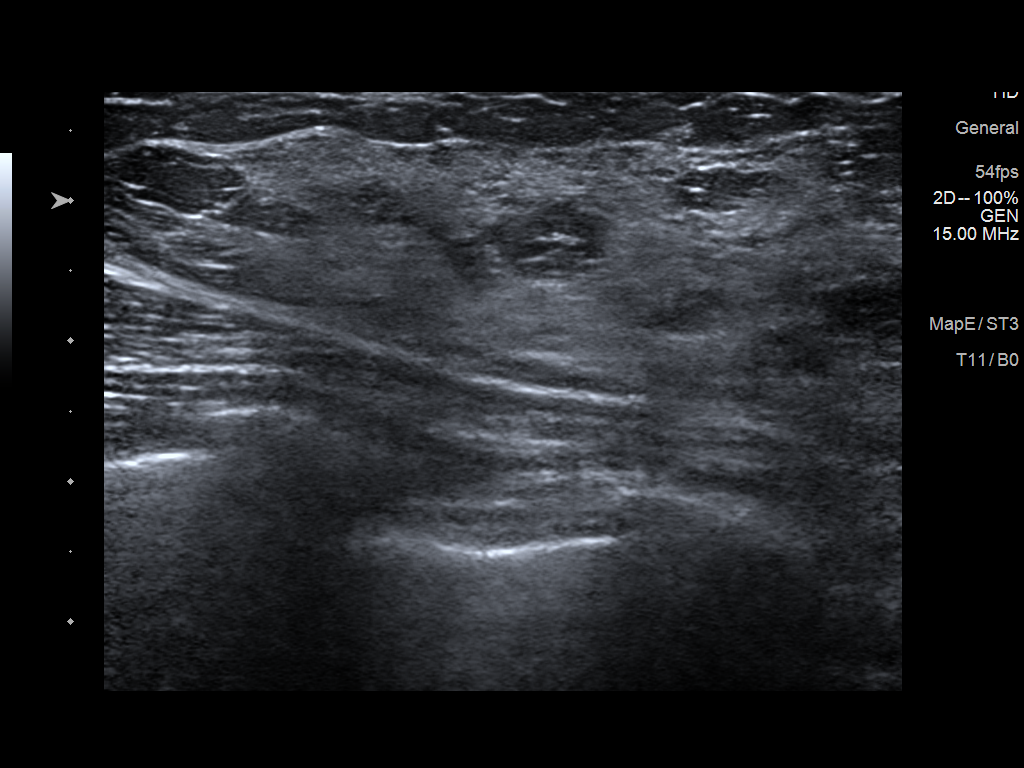

[2 of 2 positions shown; findings below may reference images not displayed]

FINDINGS: On physical exam, mild thickening in the UPPER INNER LEFT breast
identified without discrete palpable mass.

Targeted ultrasound is performed, showing no sonographic
abnormalities within the UPPER INNER LEFT breast in the area of
patient concern.
IMPRESSION: 1. No suspicious palpable or sonographic abnormalities in the UPPER
INNER LEFT breast, in the area of patient concern.

RECOMMENDATION:
Consider clinical follow-up as indicated. Any further workup should
be based on clinical grounds.

I have discussed the findings and recommendations with the patient.
If applicable, a reminder letter will be sent to the patient
regarding the next appointment.

BI-RADS CATEGORY  1: Negative.

## 2023-01-19 IMAGING — US US MFM OB COMP +14 WKS
1 series · 13 of 28 positions shown · non-contrast
Comparison: none

[Series 1: us mfm ob comp +14 wks · 13 of 110 slices shown]
[im 5/110]
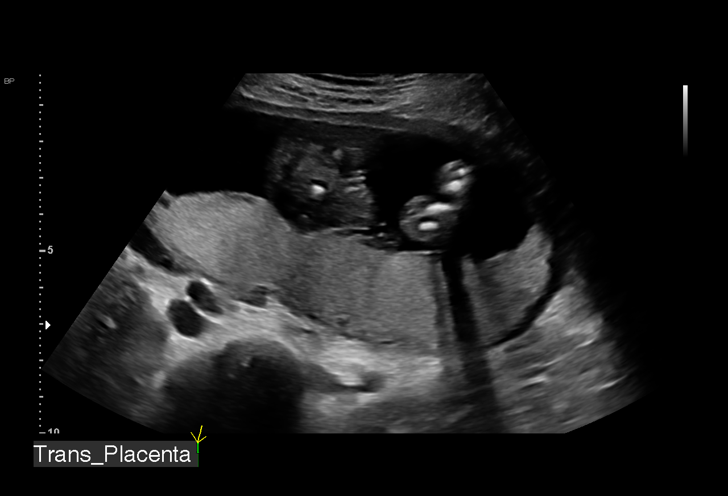
[im 13/110]
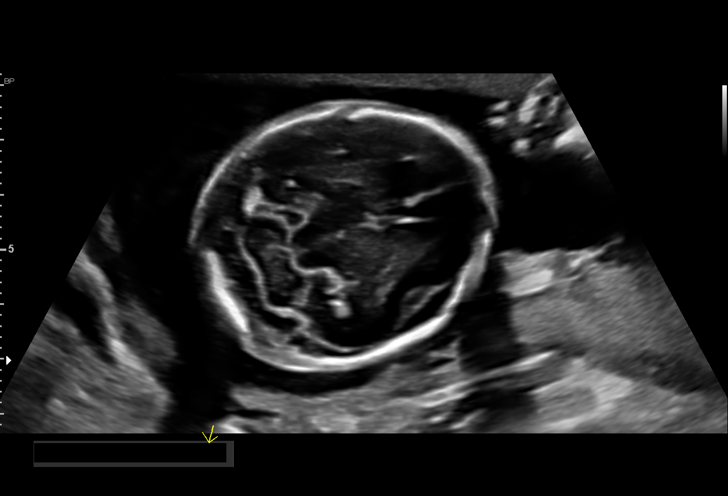
[im 21/110]
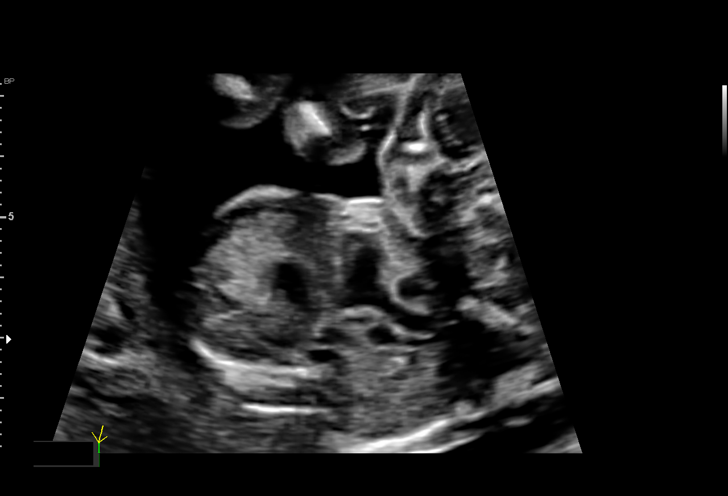
[im 29/110]
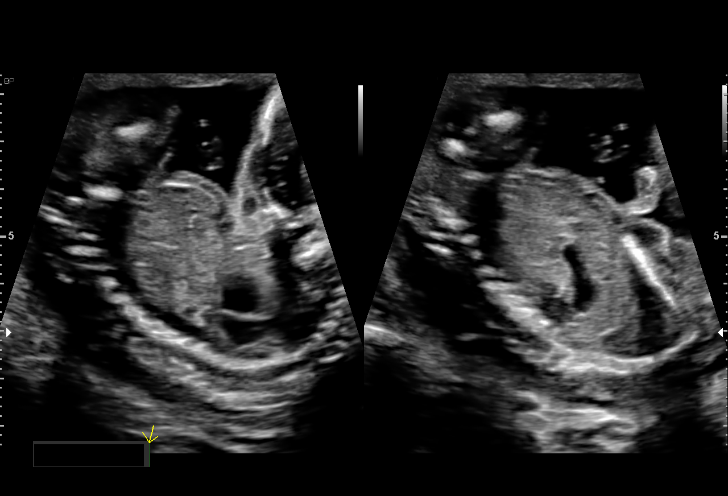
[im 37/110]
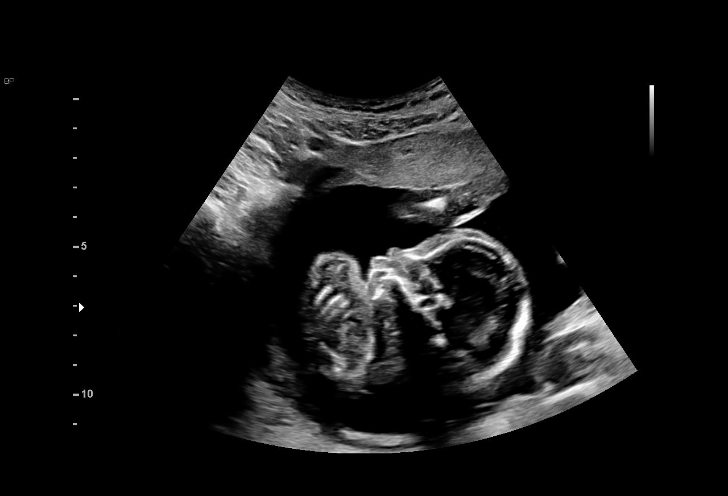
[im 45/110]
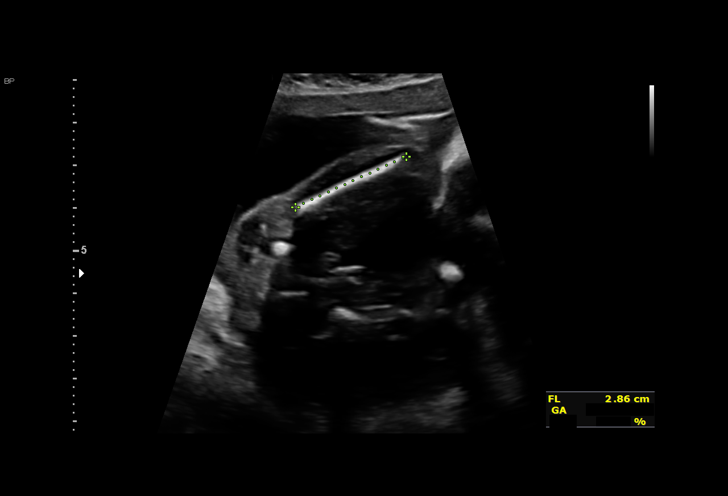
[im 57/110]
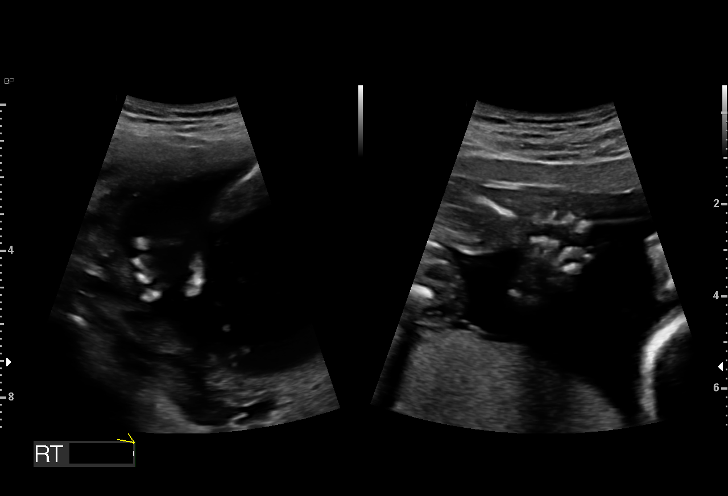
[im 65/110]
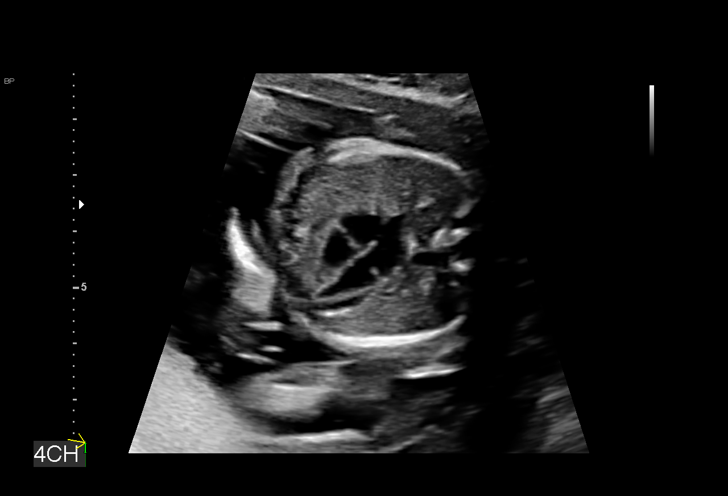
[im 73/110]
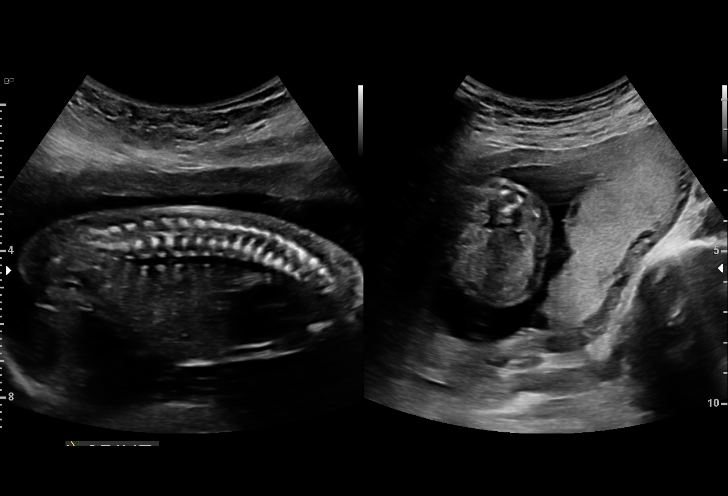
[im 81/110]
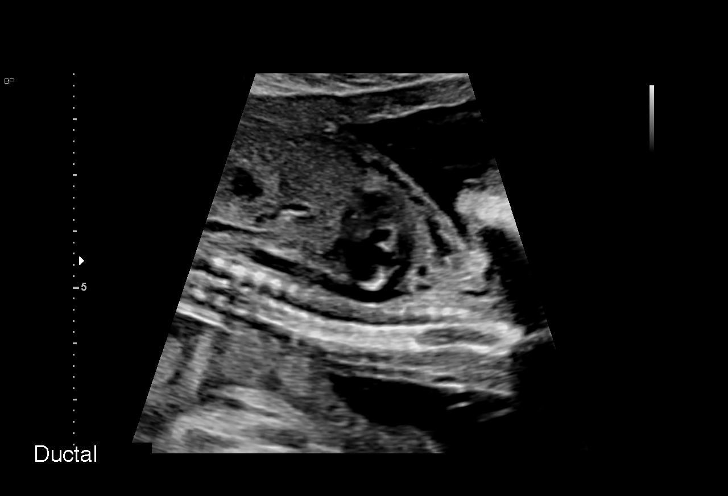
[im 89/110]
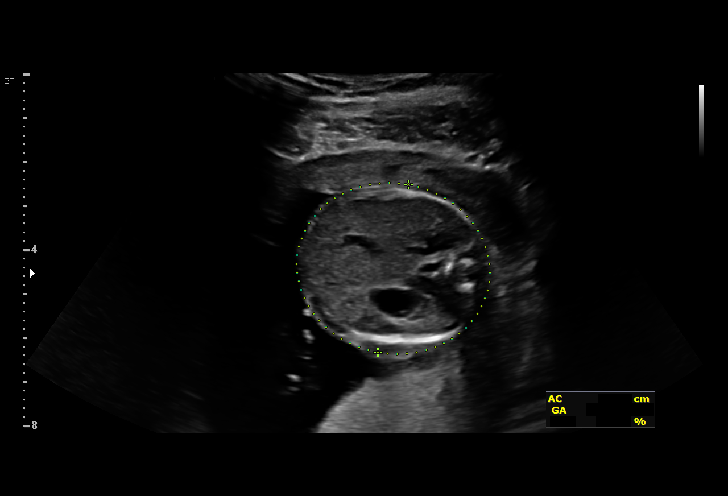
[im 97/110]
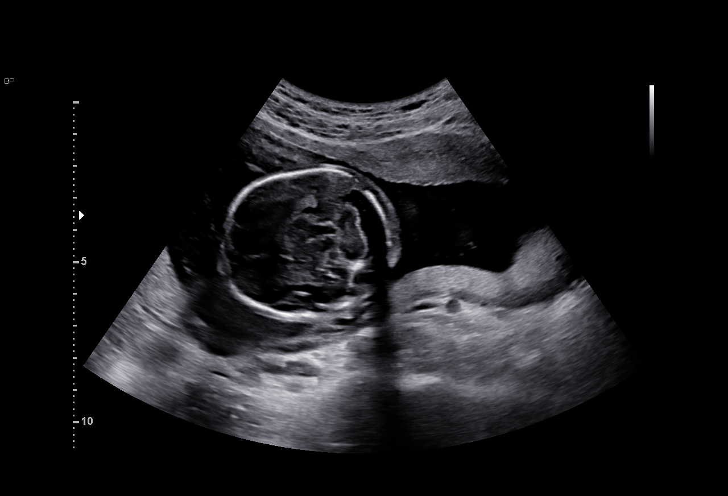
[im 105/110]
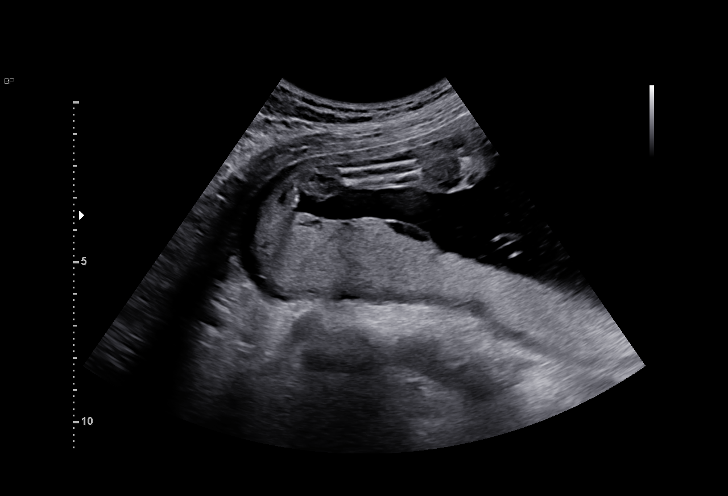

[13 of 28 positions shown; findings below may reference images not displayed]

TILANUS NP

Indications

 Encounter for antenatal screening for
 malformations
 19 weeks gestation of pregnancy
 LR NIPS/Negative Horizon
Fetal Evaluation

 Num Of Fetuses:         1
 Fetal Heart Rate(bpm):  140
 Cardiac Activity:       Observed
 Presentation:           Cephalic
 Placenta:               Posterior
 P. Cord Insertion:      Visualized, central

 Amniotic Fluid
 AFI FV:      Within normal limits

                             Largest Pocket(cm)

Biometry

 BPD:        44  mm     G. Age:  19w 2d         64  %    CI:         75.4   %    70 - 86
                                                         FL/HC:      17.9   %    16.1 -
 HC:      160.7  mm     G. Age:  18w 6d         37  %    HC/AC:      1.24        1.09 -
 AC:      130.1  mm     G. Age:  18w 4d         31  %    FL/BPD:     65.2   %
 FL:       28.7  mm     G. Age:  18w 6d         36  %    FL/AC:      22.1   %    20 - 24
 HUM:      28.1  mm     G. Age:  19w 0d         51  %
 CER:      19.1  mm     G. Age:  18w 5d         19  %
 NFT:       4.2  mm
 LV:        5.5  mm
 CM:        4.2  mm

 Est. FW:     254  gm      0 lb 9 oz     30  %
OB History

 Blood Type:   O+
 Gravidity:    2         Term:   1
 Living:       1
Gestational Age

 LMP:           18w 2d        Date:  07/06/21                 EDD:   04/12/22
 U/S Today:     18w 6d                                        EDD:   04/08/22
 Best:          19w 0d     Det. By:  Early Ultrasound         EDD:   04/07/22
                                     (09/10/21)
Anatomy

 Cranium:               Appears normal         LVOT:                   Appears normal
 Cavum:                 Appears normal         Aortic Arch:            Appears normal
 Ventricles:            Appears normal         Ductal Arch:            Appears normal
 Choroid Plexus:        Appears normal         Diaphragm:              Appears normal
 Cerebellum:            Appears normal         Stomach:                Appears normal, left
                                                                       sided
 Posterior Fossa:       Appears normal         Abdomen:                Appears normal
 Nuchal Fold:           Appears normal         Abdominal Wall:         Appears nml (cord
                                                                       insert, abd wall)
 Face:                  Appears normal         Cord Vessels:           Appears normal (3
                        (orbits and profile)                           vessel cord)
 Lips:                  Appears normal         Kidneys:                Appear normal
 Palate:                Appears normal         Bladder:                Appears normal
 Thoracic:              Appears normal         Spine:                  Appears normal
 Heart:                 Appears normal         Upper Extremities:      Appears normal
                        (4CH, axis, and
                        situs)
 RVOT:                  Appears normal         Lower Extremities:      Appears normal

 Other:  VC, 3VV and 3VTV visualized. Fetus appears to be female. Nasal
         bone visualized. Heels/feet and open hands/5th digits visualized.
         Lenses visualized.
Cervix Uterus Adnexa

 Cervix
 Length:            3.2  cm.
 Normal appearance by transabdominal scan.

 Uterus
 Normal shape and size.

 Right Ovary
 Within normal limits.

 Left Ovary
 Not visualized.
 Cul De Sac
 No free fluid seen.

 Adnexa
 No adnexal mass visualized.
Comments

 This patient was seen for a detailed fetal anatomy scan.
 She denies any other significant past medical history and
 denies any problems in her current pregnancy.
 She had a cell free DNA test earlier in her pregnancy which
 indicated a low risk for trisomy 21, 18, and 13. A female fetus
 is predicted.
 She was informed that the fetal growth and amniotic fluid
 level were appropriate for her gestational age.
 There were no obvious fetal anomalies noted on today's
 ultrasound exam.
 The patient was informed that anomalies may be missed due
 to technical limitations. If the fetus is in a suboptimal position
 or maternal habitus is increased, visualization of the fetus in
 the maternal uterus may be impaired.
 Follow up as indicated.

## 2023-06-11 IMAGING — US US PELVIS COMPLETE
1 series · 15 of 25 positions shown · non-contrast
Comparison: None Available.

CLINICAL DATA: Abdominal pain.  Vaginal delivery on 03/29/2022.

EXAM:
TRANSABDOMINAL ULTRASOUND OF PELVIS
TECHNIQUE: Transabdominal ultrasound examination of the pelvis was performed
including evaluation of the uterus, ovaries, adnexal regions, and
pelvic cul-de-sac.

[Series 1: us pelvis complete · 15 of 35 slices shown]
[im 1/35]
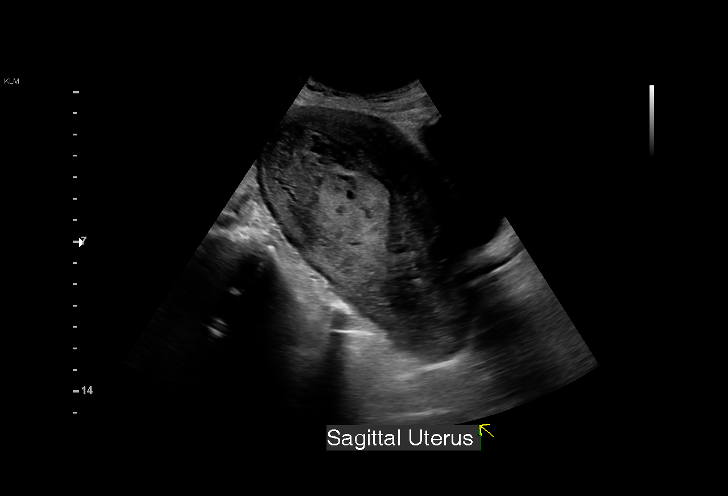
[im 3/35]
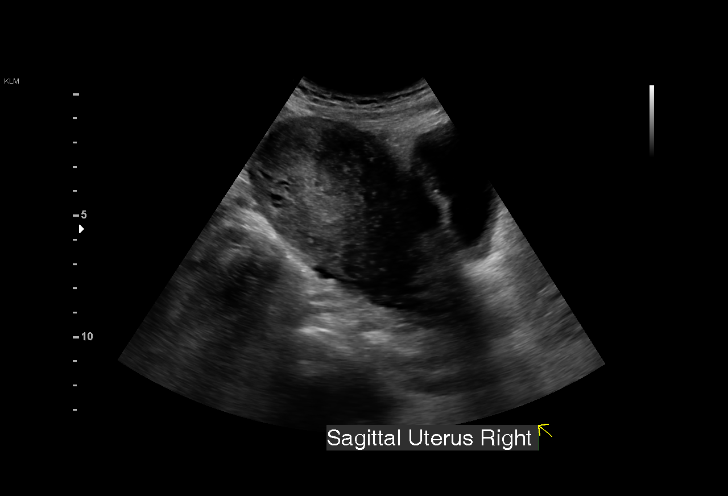
[im 6/35]
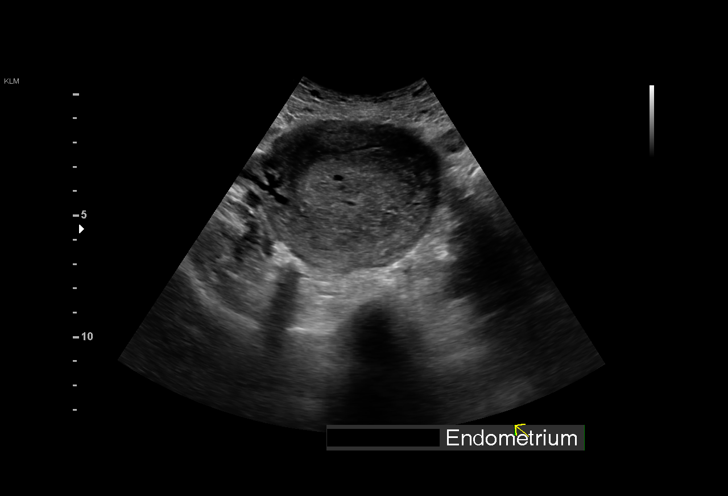
[im 8/35]
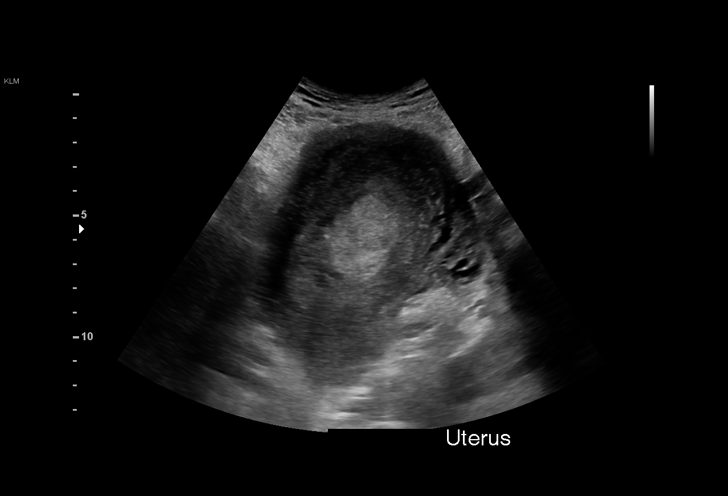
[im 10/35]
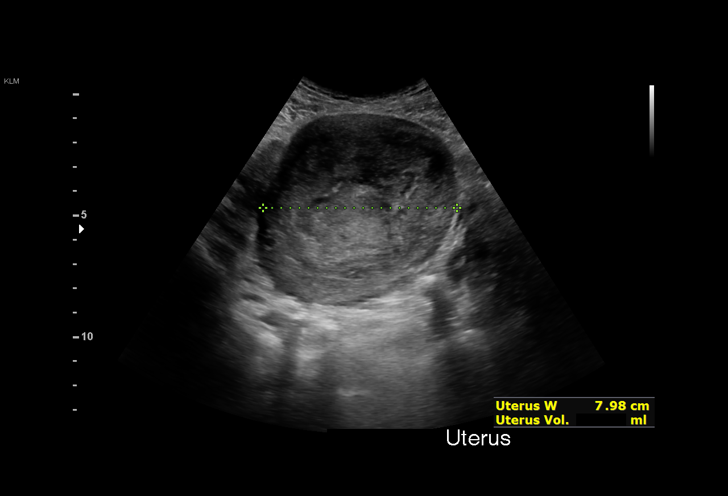
[im 13/35]
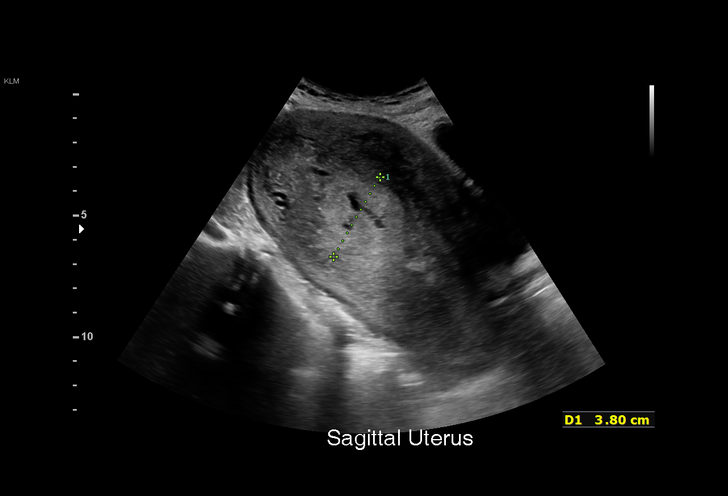
[im 15/35]
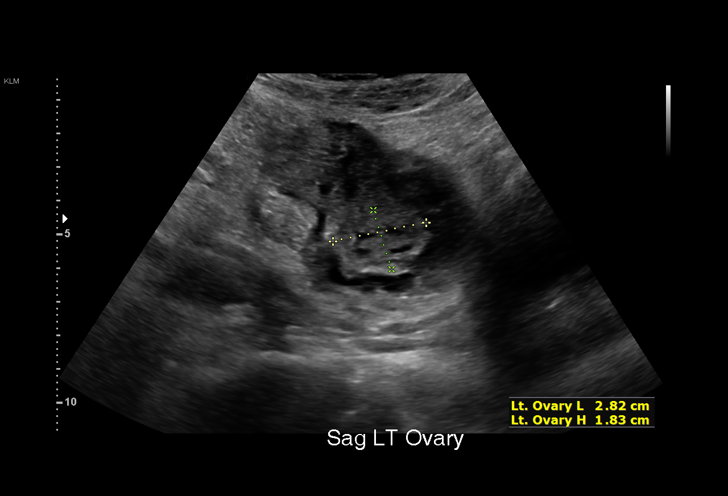
[im 18/35]
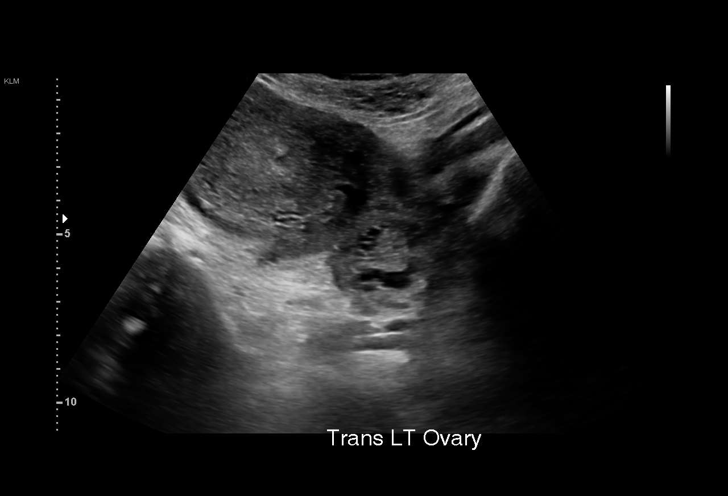
[im 20/35]
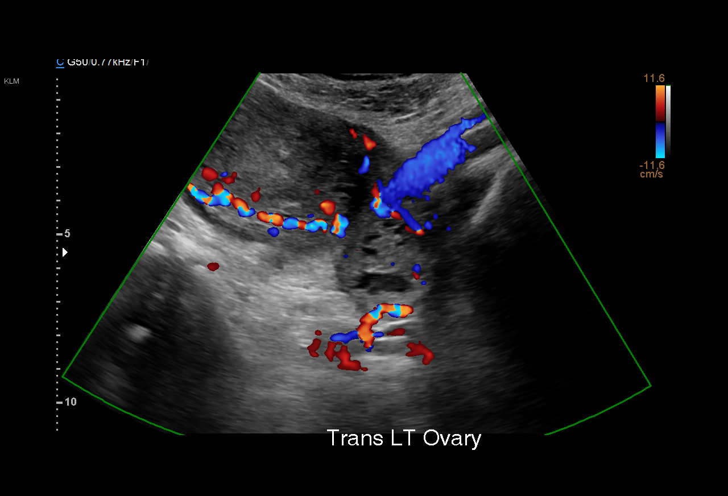
[im 22/35]
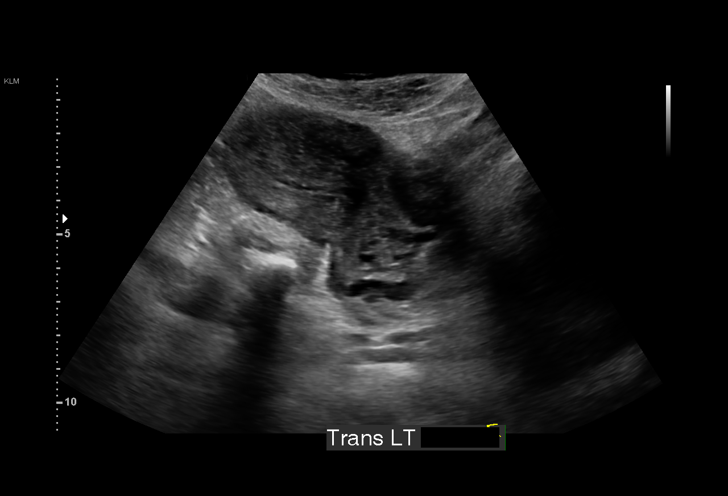
[im 25/35]
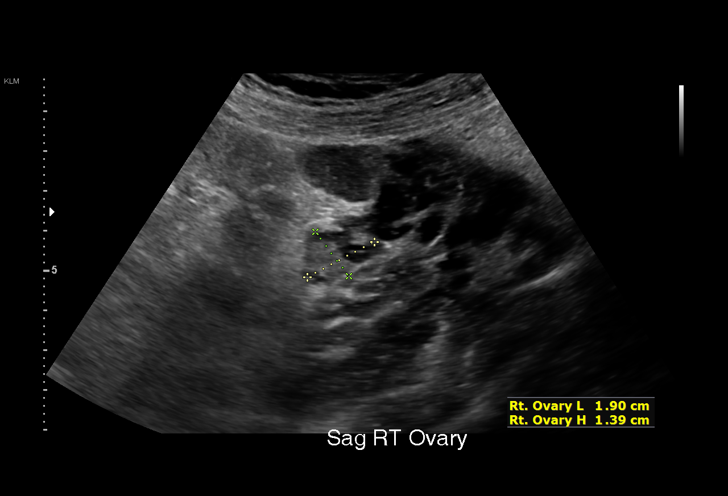
[im 27/35]
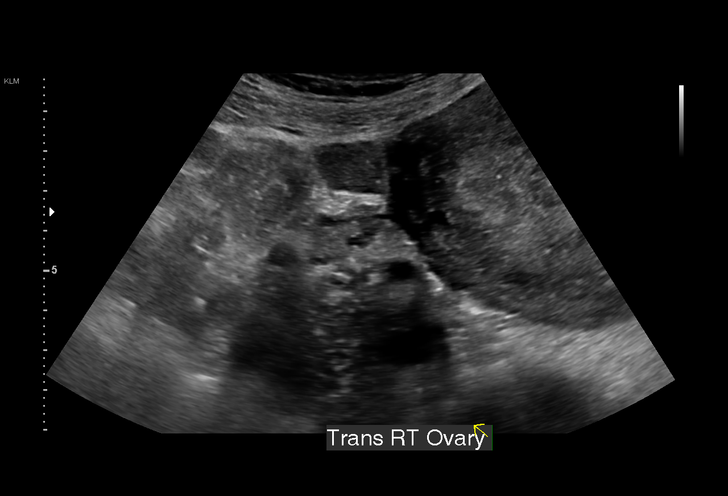
[im 29/35]
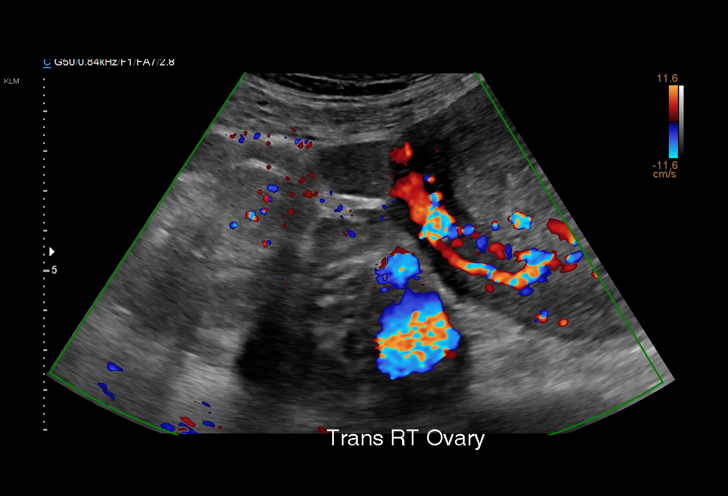
[im 32/35]
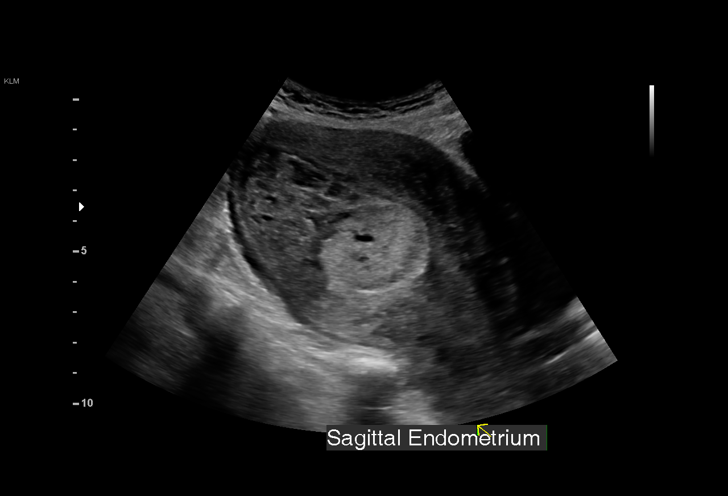
[im 35/35]
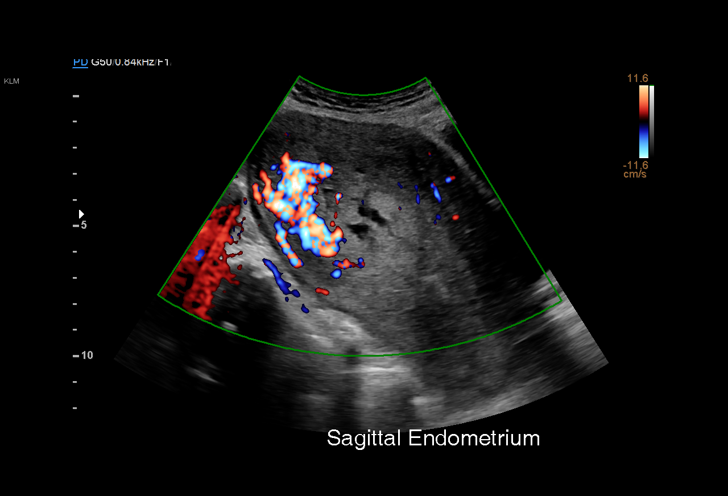

[15 of 25 positions shown; findings below may reference images not displayed]

FINDINGS: Uterus

Measurements: 13.6 x 7.2 x 8.0 cm = volume: 408 mL. No fibroids or
other mass visualized.

Endometrium

Thickness: 4.6 cm. Endometrium is heterogeneous and thickened
containing a large amount of internal vascularity along the
posterior fundal region.

Right ovary

Measurements: 1.9 x 1.4 x 1.8 cm = volume: 2.5 mL. Normal
appearance/no adnexal mass.

Left ovary

Measurements: 2.8 x 1.8 x 2.3 cm = volume: 6.2 mL. Normal
appearance/no adnexal mass.

Other findings:  No abnormal free fluid.
IMPRESSION: 1. The endometrium is heterogeneous and thickened. There is a large
amount of internal vascularity along the posterior fundal portion.
Findings are concerning for retained products of conception.
Arteriovenous malformation not excluded given this appearance.

## 2024-08-02 ENCOUNTER — Encounter: Payer: Self-pay | Admitting: Sports Medicine
